# Patient Record
Sex: Male | Born: 1945 | Race: White | Hispanic: No | Smoking: Never smoker
Health system: Southern US, Community
[De-identification: ages and names within clinical notes are randomized; demographics above are authoritative.]

## PROBLEM LIST (undated history)

## (undated) DIAGNOSIS — I639 Cerebral infarction, unspecified: Secondary | ICD-10-CM

## (undated) DIAGNOSIS — I10 Essential (primary) hypertension: Secondary | ICD-10-CM

## (undated) DIAGNOSIS — I2510 Atherosclerotic heart disease of native coronary artery without angina pectoris: Secondary | ICD-10-CM

## (undated) DIAGNOSIS — G919 Hydrocephalus, unspecified: Secondary | ICD-10-CM

## (undated) DIAGNOSIS — I219 Acute myocardial infarction, unspecified: Secondary | ICD-10-CM

## (undated) DIAGNOSIS — R482 Apraxia: Secondary | ICD-10-CM

## (undated) DIAGNOSIS — R531 Weakness: Secondary | ICD-10-CM

## (undated) DIAGNOSIS — I69351 Hemiplegia and hemiparesis following cerebral infarction affecting right dominant side: Secondary | ICD-10-CM

## (undated) DIAGNOSIS — I4891 Unspecified atrial fibrillation: Secondary | ICD-10-CM

## (undated) DIAGNOSIS — E785 Hyperlipidemia, unspecified: Secondary | ICD-10-CM

## (undated) DIAGNOSIS — G20A1 Parkinson's disease without dyskinesia, without mention of fluctuations: Secondary | ICD-10-CM

## (undated) DIAGNOSIS — G459 Transient cerebral ischemic attack, unspecified: Secondary | ICD-10-CM

## (undated) DIAGNOSIS — D128 Benign neoplasm of rectum: Secondary | ICD-10-CM

## (undated) DIAGNOSIS — F339 Major depressive disorder, recurrent, unspecified: Secondary | ICD-10-CM

## (undated) DIAGNOSIS — Z86718 Personal history of other venous thrombosis and embolism: Secondary | ICD-10-CM

## (undated) DIAGNOSIS — F329 Major depressive disorder, single episode, unspecified: Secondary | ICD-10-CM

## (undated) DIAGNOSIS — R4701 Aphasia: Secondary | ICD-10-CM

## (undated) DIAGNOSIS — I83893 Varicose veins of bilateral lower extremities with other complications: Secondary | ICD-10-CM

## (undated) DIAGNOSIS — G8929 Other chronic pain: Secondary | ICD-10-CM

## (undated) DIAGNOSIS — R41841 Cognitive communication deficit: Secondary | ICD-10-CM

## (undated) DIAGNOSIS — F4323 Adjustment disorder with mixed anxiety and depressed mood: Secondary | ICD-10-CM

## (undated) DIAGNOSIS — I872 Venous insufficiency (chronic) (peripheral): Secondary | ICD-10-CM

## (undated) DIAGNOSIS — J8410 Pulmonary fibrosis, unspecified: Secondary | ICD-10-CM

## (undated) HISTORY — DX: Apraxia: R48.2

## (undated) HISTORY — DX: Aphasia: R47.01

## (undated) HISTORY — DX: Hemiplegia and hemiparesis following cerebral infarction affecting right dominant side: I69.351

## (undated) HISTORY — DX: Unspecified atrial fibrillation: I48.91

## (undated) HISTORY — PX: CARDIAC SURGERY: SHX584

## (undated) HISTORY — PX: EYE SURGERY: SHX253

---

## 2011-01-04 HISTORY — PX: PHACOEMULSIFICATION, IOL CATARACT: SHX4968

## 2011-09-04 HISTORY — PX: CARDIAC CATHETERIZATION: SHX172

## 2011-09-19 DIAGNOSIS — R9439 Abnormal result of other cardiovascular function study: Secondary | ICD-10-CM

## 2011-09-19 HISTORY — DX: Abnormal result of other cardiovascular function study: R94.39

## 2011-09-30 DIAGNOSIS — I8003 Phlebitis and thrombophlebitis of superficial vessels of lower extremities, bilateral: Secondary | ICD-10-CM

## 2011-09-30 HISTORY — DX: Phlebitis and thrombophlebitis of superficial vessels of lower extremities, bilateral: I80.03

## 2015-01-04 HISTORY — PX: CORONARY ANGIOPLASTY WITH STENT PLACEMENT: SHX49

## 2015-10-16 DIAGNOSIS — G3184 Mild cognitive impairment, so stated: Secondary | ICD-10-CM

## 2015-10-16 HISTORY — DX: Mild cognitive impairment of uncertain or unknown etiology: G31.84

## 2016-01-04 HISTORY — PX: VENTRICULOPERITONEAL SHUNT: SHX204

## 2016-06-21 DIAGNOSIS — G912 (Idiopathic) normal pressure hydrocephalus: Secondary | ICD-10-CM

## 2016-06-21 HISTORY — DX: (Idiopathic) normal pressure hydrocephalus: G91.2

## 2016-08-10 ENCOUNTER — Encounter (HOSPITAL_COMMUNITY): Payer: Self-pay

## 2016-08-10 ENCOUNTER — Emergency Department (HOSPITAL_COMMUNITY): Payer: Medicare Other

## 2016-08-10 DIAGNOSIS — I2510 Atherosclerotic heart disease of native coronary artery without angina pectoris: Secondary | ICD-10-CM | POA: Insufficient documentation

## 2016-08-10 DIAGNOSIS — Z7982 Long term (current) use of aspirin: Secondary | ICD-10-CM | POA: Insufficient documentation

## 2016-08-10 DIAGNOSIS — I10 Essential (primary) hypertension: Secondary | ICD-10-CM | POA: Diagnosis not present

## 2016-08-10 DIAGNOSIS — H532 Diplopia: Secondary | ICD-10-CM | POA: Insufficient documentation

## 2016-08-10 DIAGNOSIS — Z7901 Long term (current) use of anticoagulants: Secondary | ICD-10-CM | POA: Diagnosis not present

## 2016-08-10 DIAGNOSIS — Z8673 Personal history of transient ischemic attack (TIA), and cerebral infarction without residual deficits: Secondary | ICD-10-CM | POA: Diagnosis not present

## 2016-08-10 DIAGNOSIS — R202 Paresthesia of skin: Secondary | ICD-10-CM | POA: Insufficient documentation

## 2016-08-10 DIAGNOSIS — G919 Hydrocephalus, unspecified: Secondary | ICD-10-CM | POA: Diagnosis not present

## 2016-08-10 LAB — DIFFERENTIAL
Basophils Absolute: 0 10*3/uL (ref 0.0–0.1)
Basophils Relative: 1 %
EOS ABS: 0.1 10*3/uL (ref 0.0–0.7)
EOS PCT: 2 %
LYMPHS ABS: 1.9 10*3/uL (ref 0.7–4.0)
LYMPHS PCT: 47 %
MONOS PCT: 12 %
Monocytes Absolute: 0.5 10*3/uL (ref 0.1–1.0)
NEUTROS PCT: 38 %
Neutro Abs: 1.6 10*3/uL — ABNORMAL LOW (ref 1.7–7.7)

## 2016-08-10 LAB — I-STAT TROPONIN, ED: TROPONIN I, POC: 0.02 ng/mL (ref 0.00–0.08)

## 2016-08-10 LAB — PROTIME-INR
INR: 0.97
Prothrombin Time: 12.9 seconds (ref 11.4–15.2)

## 2016-08-10 LAB — I-STAT CHEM 8, ED
BUN: 18 mg/dL (ref 6–20)
CALCIUM ION: 1.21 mmol/L (ref 1.15–1.40)
CREATININE: 1 mg/dL (ref 0.61–1.24)
Chloride: 102 mmol/L (ref 101–111)
Glucose, Bld: 117 mg/dL — ABNORMAL HIGH (ref 65–99)
HEMATOCRIT: 40 % (ref 39.0–52.0)
HEMOGLOBIN: 13.6 g/dL (ref 13.0–17.0)
Potassium: 4.4 mmol/L (ref 3.5–5.1)
Sodium: 142 mmol/L (ref 135–145)
TCO2: 28 mmol/L (ref 0–100)

## 2016-08-10 LAB — COMPREHENSIVE METABOLIC PANEL
ALBUMIN: 3.9 g/dL (ref 3.5–5.0)
ALK PHOS: 52 U/L (ref 38–126)
ALT: 40 U/L (ref 17–63)
ANION GAP: 7 (ref 5–15)
AST: 34 U/L (ref 15–41)
BILIRUBIN TOTAL: 0.5 mg/dL (ref 0.3–1.2)
BUN: 15 mg/dL (ref 6–20)
CALCIUM: 9.2 mg/dL (ref 8.9–10.3)
CO2: 28 mmol/L (ref 22–32)
Chloride: 105 mmol/L (ref 101–111)
Creatinine, Ser: 1.08 mg/dL (ref 0.61–1.24)
GFR calc Af Amer: >60 mL/min (ref >60)
GLUCOSE: 124 mg/dL — AB (ref 65–99)
POTASSIUM: 4.5 mmol/L (ref 3.5–5.1)
Sodium: 140 mmol/L (ref 135–145)
TOTAL PROTEIN: 7.6 g/dL (ref 6.5–8.1)

## 2016-08-10 LAB — CBC
HEMATOCRIT: 39.4 % (ref 39.0–52.0)
HEMOGLOBIN: 13.5 g/dL (ref 13.0–17.0)
MCH: 30.7 pg (ref 26.0–34.0)
MCHC: 34.3 g/dL (ref 30.0–36.0)
MCV: 89.5 fL (ref 78.0–100.0)
Platelets: 141 10*3/uL — ABNORMAL LOW (ref 150–400)
RBC: 4.4 MIL/uL (ref 4.22–5.81)
RDW: 12.8 % (ref 11.5–15.5)
WBC: 4.2 10*3/uL (ref 4.0–10.5)

## 2016-08-10 LAB — APTT: aPTT: 25 seconds (ref 24–36)

## 2016-08-10 NOTE — ED Notes (Signed)
PT reports he feels his symptoms of increased ICP have been returning over the last week. He reports worsened gait, memory loss, and urinary incontinence.

## 2016-08-10 NOTE — ED Triage Notes (Signed)
Pt arrives to ED with complaints of double vision and numbness of bottom lip. Pt reports the symptoms come ans go and he first noticed it about a week and a half ago. He states tonight he had significant double vision for about 10 seconds and then it went away. While waiting to be triaged pt reports he had numbness to to the left side of face lasting about 15 seconds. PT reports he is seen at [facility] for hydrocephalus and is supposed to have shunt placed September 7th. He reports he has ~400cc CSF fluid drained on June 21 and is concerned this is a complication to his condition.

## 2016-08-11 ENCOUNTER — Emergency Department (HOSPITAL_COMMUNITY)
Admission: EM | Admit: 2016-08-11 | Discharge: 2016-08-11 | Disposition: A | Discharge status: Home / Self Care | Payer: Medicare Other | Attending: Emergency Medicine | Admitting: Emergency Medicine

## 2016-08-11 DIAGNOSIS — R202 Paresthesia of skin: Secondary | ICD-10-CM

## 2016-08-11 HISTORY — DX: Cerebral infarction, unspecified: I63.9

## 2016-08-11 HISTORY — DX: Hydrocephalus, unspecified: G91.9

## 2016-08-11 HISTORY — DX: Atherosclerotic heart disease of native coronary artery without angina pectoris: I25.10

## 2016-08-11 HISTORY — DX: Acute myocardial infarction, unspecified: I21.9

## 2016-08-11 HISTORY — DX: Essential (primary) hypertension: I10

## 2016-08-11 MED ORDER — GABAPENTIN 300 MG PO CAPS: 300.0000 mg | ORAL_CAPSULE | Freq: Three times a day (TID) | ORAL | 0 refills | Status: AC

## 2016-08-11 NOTE — Discharge Instructions (Signed)
Please read attached information. If you experience any new or worsening signs or symptoms please return to the emergency room for evaluation. Please follow-up with your primary care provider or specialist as discussed. Please use medication prescribed only as directed and discontinue taking if you have any concerning signs or symptoms.   °

## 2016-08-11 NOTE — ED Provider Notes (Signed)
[facility]-EMERGENCY DEPT Provider Note   CSN: [id number] Arrival date & time: 08/10/16  2029   History   Chief Complaint Chief Complaint  Patient presents with  . Visual Field Change  . Numbness    HPI Dwayne Patel is a 71 y.o. male.   HPI   70 year old male presents today with complaints of double vision and paresthesias. Patient has a significant past medical history of hypertension, hyperlipidemia, stroke 2014, MI 2015, hydrocephalus.  Patient notes that in June he was diagnosed with hydrocephalus and had a drain placed. He notes that he is followed at the [facility] clinic for this, and is scheduled for a shunt in September. Patient notes that he chronically has some memory dysfunction, unsteady gait, incontinence. He notes acutely last night around 5:30 while sitting he had a episode of double vision, and tingling around his lips. He denies any associated distal neurological deficits, headache, confusion, chest pain or shortness of breath. He reports this lasted we're seconds and resolved. He notes again he had tingling in his lips for several seconds this morning around 3 AM while in the waiting room.   Past Medical History:  Diagnosis Date  . Coronary artery disease   . CVA (cerebral vascular accident) (HCC)   . Hydrocephalus   . Hypertension   . Myocardial infarct (HCC)     There are no active problems to display for this patient.   Past Surgical History:  Procedure Laterality Date  . CARDIAC SURGERY    . EYE SURGERY         Home Medications    Prior to Admission medications   Medication Sig Start Date End Date Taking? Authorizing Provider  aspirin EC 81 MG tablet Take 81 mg by mouth daily.   Yes [provider]  atorvastatin (LIPITOR) 80 MG tablet Take 80 mg by mouth daily. 07/16/16  Yes [provider]  clopidogrel (PLAVIX) 75 MG tablet Take 75 mg by mouth daily.   Yes [provider]  metoprolol succinate (TOPROL-XL) 25 MG 24 hr  tablet Take 12.5 mg by mouth daily. 07/16/16  Yes [provider]  omeprazole (PRILOSEC) 20 MG capsule Take 20 mg by mouth daily. 07/22/16  Yes [provider]  gabapentin (NEURONTIN) 300 MG capsule Take 1 capsule (300 mg total) by mouth 3 (three) times daily. 08/11/16   Eyvonne Mechanic, PA-C    Family History No family history on file.  Social History Social History  Substance Use Topics  . Smoking status: Never Smoker  . Smokeless tobacco: Never Used  . Alcohol use No     Allergies   Ace inhibitors   Review of Systems Review of Systems  All other systems reviewed and are negative.    Physical Exam Updated Vital Signs BP (!) 161/72 (BP Location: Left Arm)   Pulse 64   Temp 98 F (36.7 C) (Oral)   Resp 16   Ht 5\' 8"  (1.727 m)   Wt 90.7 kg (200 lb)   SpO2 100%   BMI 30.41 kg/m   Physical Exam  Constitutional: He is oriented to person, place, and time. He appears well-developed and well-nourished.  HENT:  Head: Normocephalic and atraumatic.  Eyes: Pupils are equal, round, and reactive to light. Conjunctivae are normal. Right eye exhibits no discharge. Left eye exhibits no discharge. No scleral icterus.  Neck: Normal range of motion. No JVD present. No tracheal deviation present.  Cardiovascular: Normal rate, regular rhythm, normal heart sounds and intact distal pulses.  No murmur heard. Pulmonary/Chest: Effort normal and breath sounds normal. No stridor. No respiratory distress. He has no wheezes. He has no rales. He exhibits no tenderness.  Musculoskeletal: He exhibits no edema.  Neurological: He is alert and oriented to person, place, and time. He has normal strength. No cranial nerve deficit or sensory deficit. Coordination normal. GCS eye subscore is 4. GCS verbal subscore is 5. GCS motor subscore is 6.  Psychiatric: He has a normal mood and affect. His behavior is normal. Judgment and thought content normal.  Nursing note and vitals  reviewed.   ED Treatments / Results  Labs (all labs ordered are listed, but only abnormal results are displayed) Labs Reviewed  CBC - Abnormal; Notable for the following:       Result Value   Platelets 141 (*)    All other components within normal limits  DIFFERENTIAL - Abnormal; Notable for the following:    Neutro Abs 1.6 (*)    All other components within normal limits  COMPREHENSIVE METABOLIC PANEL - Abnormal; Notable for the following:    Glucose, Bld 124 (*)    All other components within normal limits  I-STAT CHEM 8, ED - Abnormal; Notable for the following:    Glucose, Bld 117 (*)    All other components within normal limits  PROTIME-INR  APTT  I-STAT TROPONIN, ED  CBG MONITORING, ED    EKG  EKG Interpretation  Date/Time:  Wednesday August 10 2016 21:13:55 EDT Ventricular Rate:  65 PR Interval:  144 QRS Duration: 96 QT Interval:  408 QTC Calculation: 424 R Axis:   13 Text Interpretation:  Normal sinus rhythm Normal ECG No old tracing to compare Confirmed by Ward, Baxter Hire [phone]) on 08/11/2016 5:41:49 AM       Radiology Ct Head Wo Contrast  Result Date: 08/10/2016 CLINICAL DATA:  Acute onset of mouth numbness and double vision. Initial encounter. EXAM: CT HEAD WITHOUT CONTRAST TECHNIQUE: Contiguous axial images were obtained from the base of the skull through the vertex without intravenous contrast. COMPARISON:  None. FINDINGS: Brain: No evidence of acute infarction, hemorrhage, hydrocephalus, extra-axial collection or mass lesion/mass effect. Prominence of the ventricles and sulci reflects moderate cortical volume loss. Scattered periventricular and subcortical white matter change likely reflects small vessel ischemic microangiopathy. Scattered chronic lacunar infarcts are noted at the cerebellar hemispheres bilaterally. The brainstem and fourth ventricle are within normal limits. The basal ganglia are unremarkable in appearance. The cerebral hemispheres demonstrate  grossly normal gray-white differentiation. No mass effect or midline shift is seen. Vascular: No hyperdense vessel or unexpected calcification. Skull: There is no evidence of fracture; visualized osseous structures are unremarkable in appearance. Sinuses/Orbits: The orbits are within normal limits. Mucosal thickening is noted at the right maxillary sinus. The patient is status post right-sided maxillary antrostomy. The remaining paranasal sinuses and mastoid air cells are well-aerated. Other: No significant soft tissue abnormalities are seen. IMPRESSION: 1. No acute intracranial pathology seen on CT. 2. Moderate cortical volume loss and scattered small vessel ischemic microangiopathy. 3. 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 Electronically Signed   By: Roanna Raider M.D.   On: 08/10/2016 22:42    Procedures Procedures (including critical care time)  Medications Ordered in ED Medications - No data to display   Initial Impression / Assessment and Plan / ED Course  I have reviewed the triage vital signs and the nursing notes.  Pertinent labs & imaging results that were available during my care of the patient  were reviewed by me and considered in my medical decision making (see chart for details).      Final Clinical Impressions(s) / ED Diagnoses   Final diagnoses:  Paresthesia    Labs: I-STAT Chem-8, i-STAT troponin, PT/INR, APTT, CBC, differential, CMP  Imaging: CT head without  Consults:Neurology  Therapeutics:  Discharge Meds: Gabapentin  Assessment/Plan: 71 year old male presents today with likely paresthesias. Patient is asymptomatic at the time my evaluation. His presentation is not consistent with stroke, TIA, or any decreased perfusion to the brain. I discussed the case with neurology who recommended starting patient on gabapentin 300 3 times a day for paresthesias. Patient will follow up closely with [place] neurolog[place] in [place] on Monday, he is  given strict return precautions. He verbalized understanding and agreement to today's plan had no further questions or concerns at time of discharge.       New Prescriptions New Prescriptions   GABAPENTIN (NEURONTIN) 300 MG CAPSULE    Take 1 capsule (300 mg total) by mouth 3 (three) times daily.     Eyvonne MechanicHedges, Orianna Biskup, PA-C 08/11/16 [id number]    Shon BatonHorton, Courtney F, MD 08/15/16 [phone]

## 2016-08-11 NOTE — ED Provider Notes (Signed)
Medical screening examination/treatment/procedure(s) were conducted as a shared visit with non-physician practitioner(s) and myself.  I personally evaluated the patient during the encounter.   EKG Interpretation  Date/Time:  Wednesday August 10 2016 21:13:55 EDT Ventricular Rate:  65 PR Interval:  144 QRS Duration: 96 QT Interval:  408 QTC Calculation: 424 R Axis:   13 Text Interpretation:  Normal sinus rhythm Normal ECG No old tracing to compare Confirmed by Loria Lacina, Baxter HireKristen [phone]([id number]) on 08/11/2016 5:41:49 AM      Patient is a 71 year old male with history of normal pressure hydrocephalus who has had recent [facility] [place] in June who presents emergency department with complaints of numbness that occurred to his lower lip bilaterally, binocular diplopia that started yesterday. Has had 2 episodes of these symptoms that lasted for several seconds and then resolve. He currently has difficulty with word finding and some ataxic gait but states this is chronic for him but has improved since his last CSF drainage. He is scheduled to have a [facility]. On exam, patient is currently neurologically intact. His labs are unremarkable.  His head CT is unremarkable. He does not have any signs of hydrocephalus currently.  Neurology recommended increasing gabapentin for paresthesias. We'll agree that patient can be discharged with close follow-up with his neurologist at [facility] which he has scheduled beginning of next week. Discussed return precautions. Patient comfortable with this plan and is ready for discharge.   Dwayne Patel, Layla MawKristen N, DO 08/11/16 2301

## 2016-09-09 HISTORY — PX: VENTRICULOPERITONEAL SHUNT: SHX204

## 2018-04-24 IMAGING — CT CT HEAD W/O CM
3 series · 15 of 47 positions shown, 18 images · non-contrast
Comparison: None.

CLINICAL DATA: Acute onset of mouth numbness and double vision.
Initial encounter.

EXAM:
CT HEAD WITHOUT CONTRAST
TECHNIQUE: Contiguous axial images were obtained from the base of the skull
through the vertex without intravenous contrast.

[Series 3: head 5.0 h30s · axial · 0.42mm/px · z∈[-161,-6]mm · 9 of 37 slices shown, 12 images]
[im 3/37  brain]
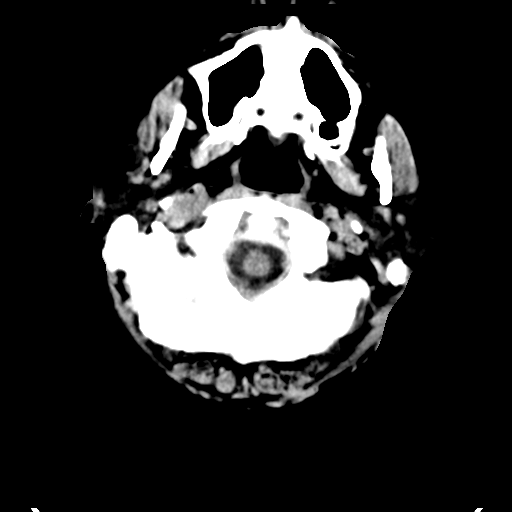
[im 3/37  bone]
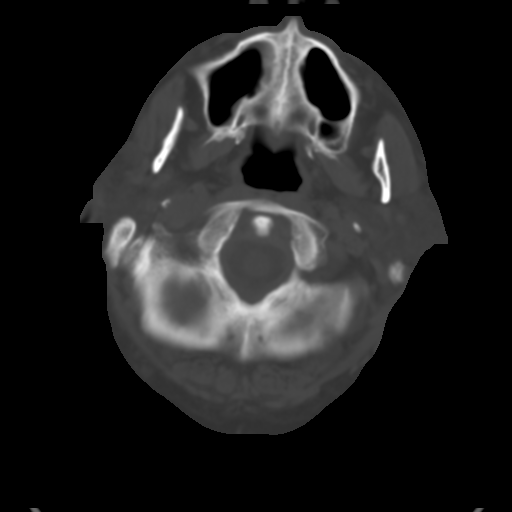
[im 7/37  brain]
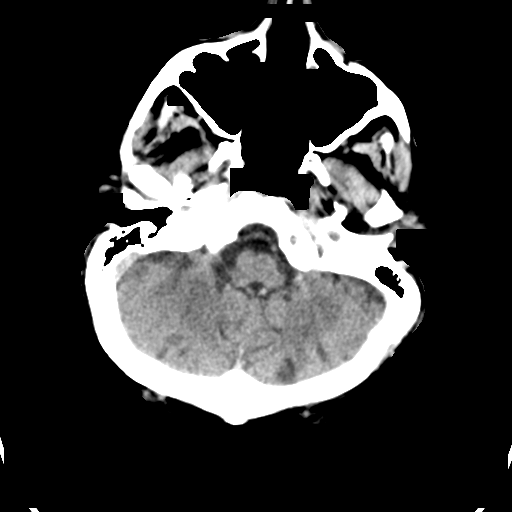
[im 10/37  brain]
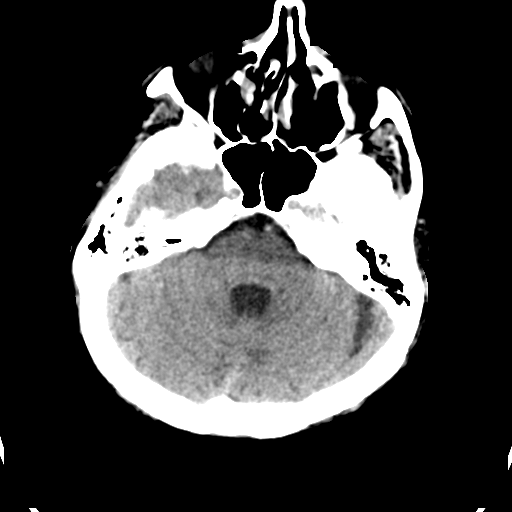
[im 14/37  brain]
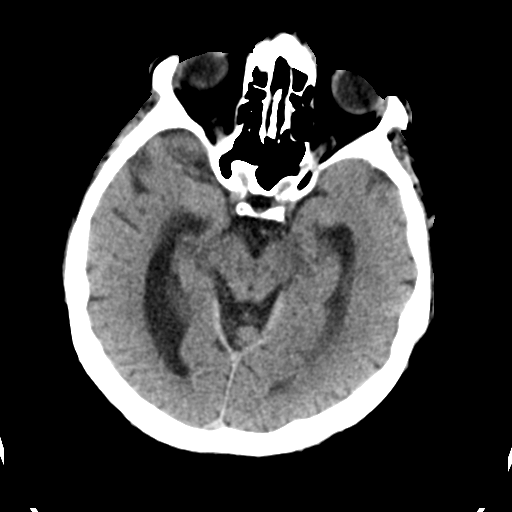
[im 19/37  brain]
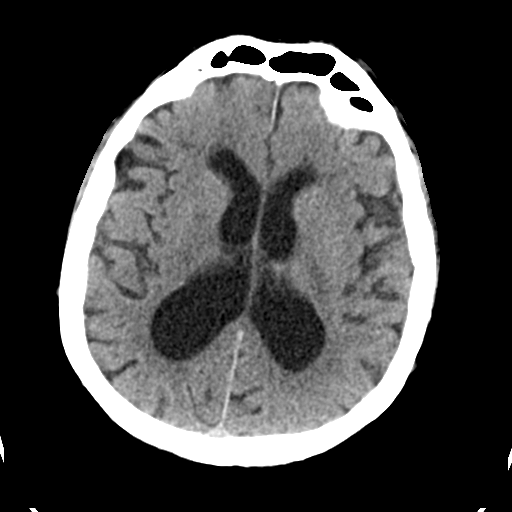
[im 19/37  bone]
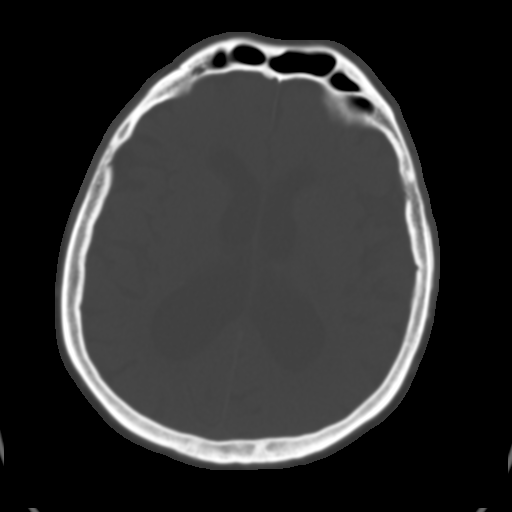
[im 23/37  brain]
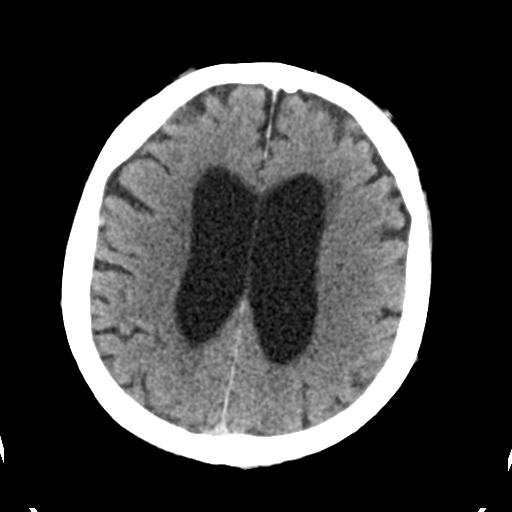
[im 27/37  brain]
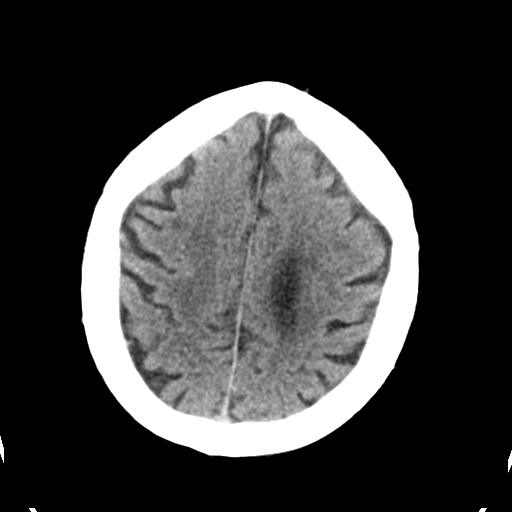
[im 30/37  brain]
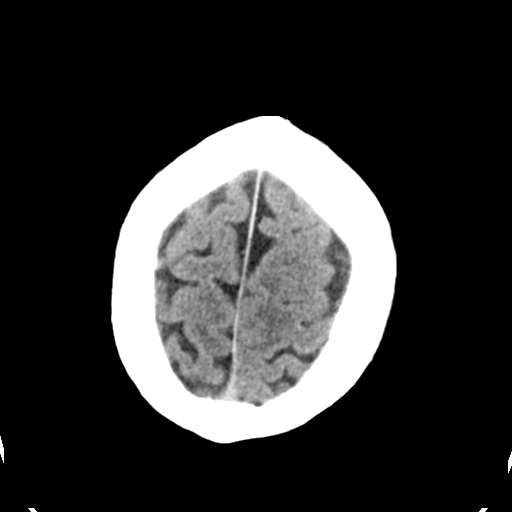
[im 34/37  brain]
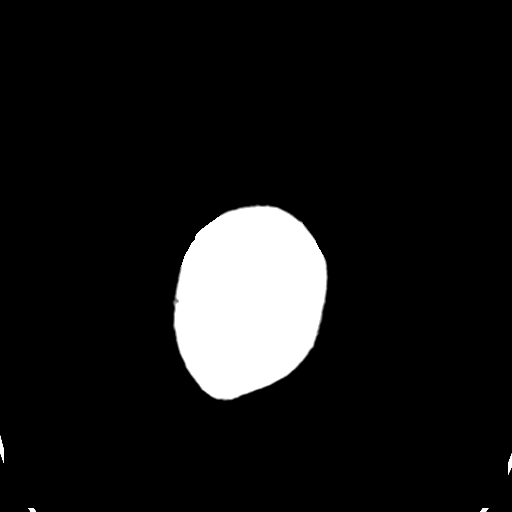
[im 34/37  bone]
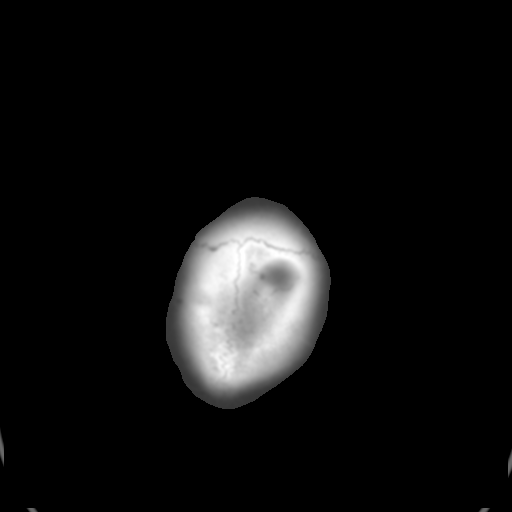

[Series 5: head 3.0 mpr cor · coronal · 0.36mm/px · 3 of 68 slices shown]
[im 23/68  brain]
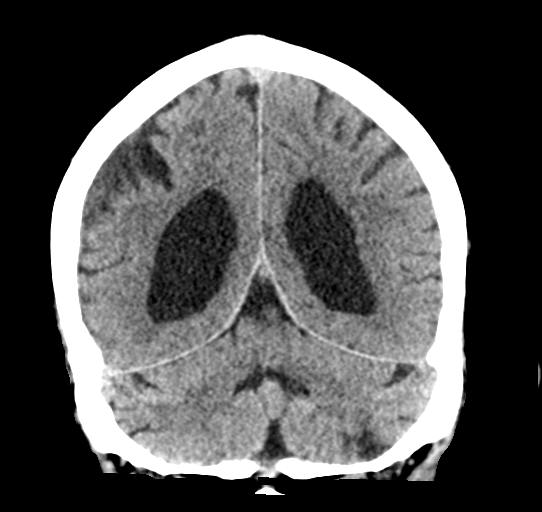
[im 30/68  brain]
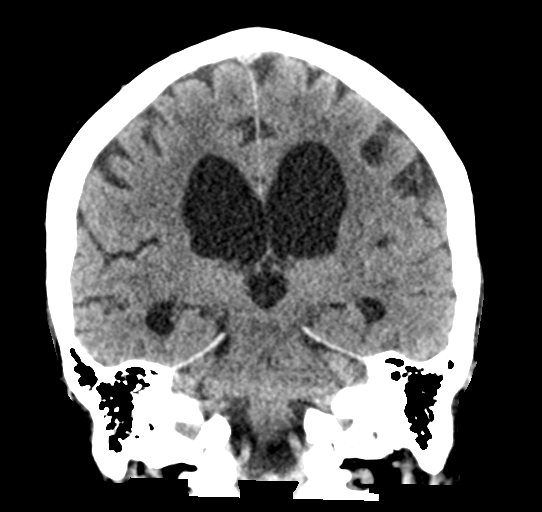
[im 38/68  brain]
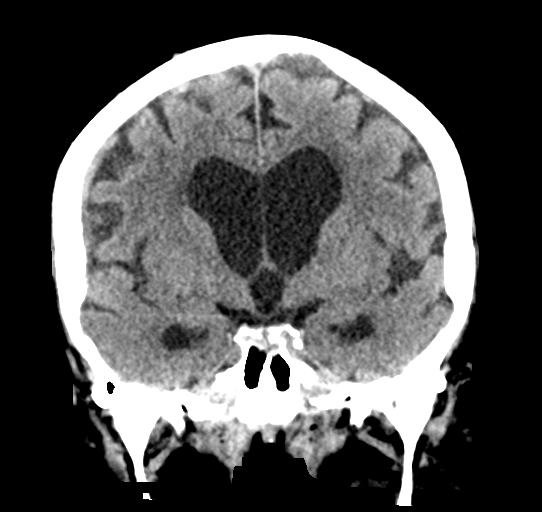

[Series 6: head 3.0 mpr sag · sagittal · 0.38mm/px · 3 of 66 slices shown]
[im 22/66  brain]
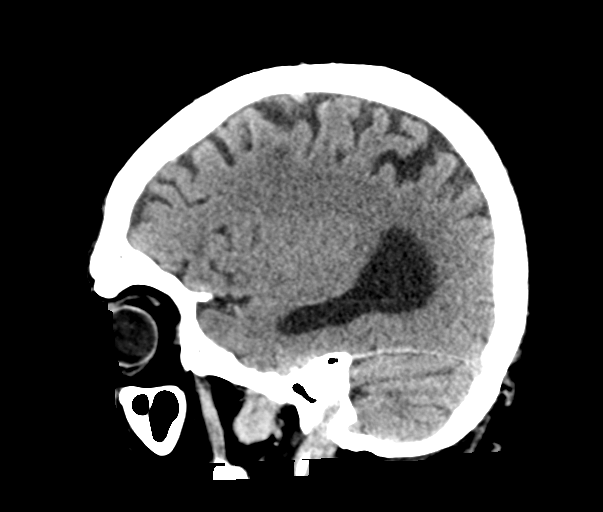
[im 33/66  brain]
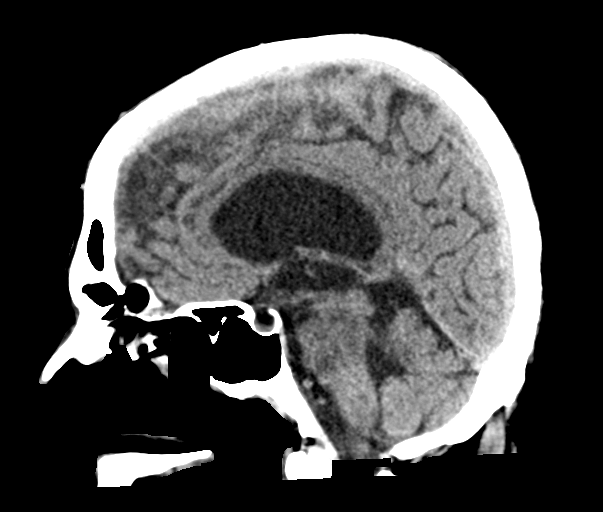
[im 44/66  brain]
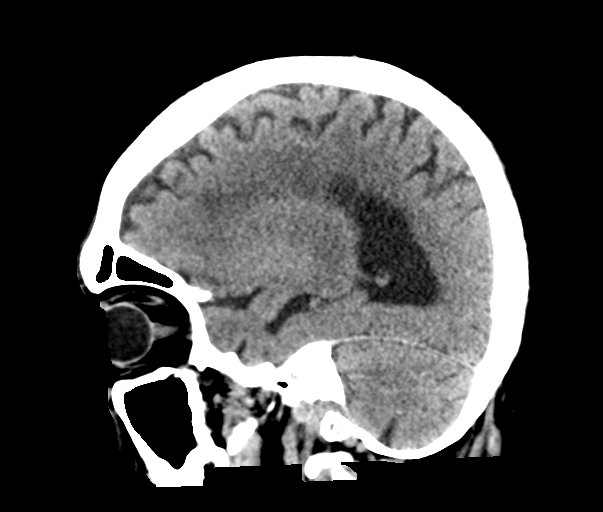

[15 of 47 positions shown; findings below may reference images not displayed]

FINDINGS: Brain: No evidence of acute infarction, hemorrhage, hydrocephalus,
extra-axial collection or mass lesion/mass effect.

Prominence of the ventricles and sulci reflects moderate cortical
volume loss. Scattered periventricular and subcortical white matter
change likely reflects small vessel ischemic microangiopathy.
Scattered chronic lacunar infarcts are noted at the cerebellar
hemispheres bilaterally.

The brainstem and fourth ventricle are within normal limits. The
basal ganglia are unremarkable in appearance. The cerebral
hemispheres demonstrate grossly normal gray-white differentiation.
No mass effect or midline shift is seen.

Vascular: No hyperdense vessel or unexpected calcification.

Skull: There is no evidence of fracture; visualized osseous
structures are unremarkable in appearance.

Sinuses/Orbits: The orbits are within normal limits. Mucosal
thickening is noted at the right maxillary sinus. The patient is
status post right-sided maxillary antrostomy. The remaining
paranasal sinuses and mastoid air cells are well-aerated.

Other: No significant soft tissue abnormalities are seen.
IMPRESSION: 1. No acute intracranial pathology seen on CT.
2. Moderate cortical volume loss and scattered small vessel ischemic
microangiopathy.
3. Chronic lacunar infarcts at the cerebellar hemispheres
bilaterally.

## 2018-05-21 DIAGNOSIS — D6851 Activated protein C resistance: Secondary | ICD-10-CM

## 2018-05-21 DIAGNOSIS — I2609 Other pulmonary embolism with acute cor pulmonale: Secondary | ICD-10-CM

## 2018-05-21 HISTORY — DX: Other pulmonary embolism with acute cor pulmonale: I26.09

## 2018-05-21 HISTORY — DX: Activated protein C resistance: D68.51

## 2020-07-24 ENCOUNTER — Ambulatory Visit: Admission: RE | Admit: 2020-07-24 | Payer: Self-pay | Source: Ambulatory Visit

## 2020-07-26 DIAGNOSIS — I639 Cerebral infarction, unspecified: Secondary | ICD-10-CM

## 2020-07-26 HISTORY — DX: Cerebral infarction, unspecified: I63.9

## 2020-09-04 DIAGNOSIS — T85618D Breakdown (mechanical) of other specified internal prosthetic devices, implants and grafts, subsequent encounter: Secondary | ICD-10-CM

## 2020-09-04 HISTORY — DX: Breakdown (mechanical) of other specified internal prosthetic devices, implants and grafts, subsequent encounter: T85.618D

## 2021-06-22 ENCOUNTER — Telehealth (INDEPENDENT_AMBULATORY_CARE_PROVIDER_SITE_OTHER): Payer: Self-pay

## 2021-06-22 NOTE — Telephone Encounter (Signed)
06/22/21 1507  Called pt to confirm appt duaghter confirmed appt and will do E check in on MyChart and will bring in disc from July 2022. JB, LPN

## 2021-06-28 ENCOUNTER — Other Ambulatory Visit: Payer: Self-pay

## 2021-06-28 ENCOUNTER — Ambulatory Visit (INDEPENDENT_AMBULATORY_CARE_PROVIDER_SITE_OTHER): Payer: Medicare Other | Admitting: Physician Assistant

## 2021-06-28 ENCOUNTER — Encounter (INDEPENDENT_AMBULATORY_CARE_PROVIDER_SITE_OTHER): Payer: Self-pay

## 2021-06-28 ENCOUNTER — Encounter (INDEPENDENT_AMBULATORY_CARE_PROVIDER_SITE_OTHER): Payer: Self-pay | Admitting: Physician Assistant

## 2021-06-28 DIAGNOSIS — G912 (Idiopathic) normal pressure hydrocephalus: Secondary | ICD-10-CM | POA: Insufficient documentation

## 2021-06-28 DIAGNOSIS — Z982 Presence of cerebrospinal fluid drainage device: Secondary | ICD-10-CM

## 2021-06-28 NOTE — Progress Notes (Signed)
Review of Systems   Review of Systems   Constitution: Negative.   HENT:  Positive for hearing loss.    Eyes: Negative.    Cardiovascular: Negative.         Respiratory:  Positive for snoring.    Endocrine: Positive for cold intolerance.   Hematologic/Lymphatic: Negative.    Skin: Negative.    Musculoskeletal:  Positive for arthritis and muscle weakness.       Taken by: Jearld Pies, LPN

## 2021-06-28 NOTE — Progress Notes (Signed)
[facility]  New Patient Note    Referring MD: Judeth Cornfield, MD   Primary Care MD: Jeanann Lewandowsky, MD     MRN: [id number]    HPI   Chief Complaint:  NPH    HPI  [name] is a 76 y.o. 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 He is accompanied by his daughter. He recently moved to the area from [place].     He had initially VP shunt placement in 2018. He then developed worsening gait, and was found to have shunt block. He underwent revision in 2022 at [facility]. He unfortunately suffered a stroke around that same time with right-sided weakness. He was doing well until COVID infection in 10/2020. He has had difficulty walking since then, but reports gradual improvement over the past 2 months. He can walk a few steps with assistance. He still has intermittent urinary incontinence. He denies memory difficulty. He lives in a nursing home and does PT 2-3 times a week. Dr. Dillard Cannon referred him to establish care with neurosurgery. He is scheduled for follow up in 09/2021 with updated imaging.       Radiology Interpretation   No results found.     The images above were personally reviewed and discussed in detail with the patient.     Impression   [name] is a 76 y/o male with hx NPH s/p VP shunt with Certas valve set to 5 and CVA with residual right upper extremity weakness who presents today to establish care after relocating from [place]. He is doing well overall with no new symptoms or concerns. He has difficulty walking which is improving with PT. I verified the shunt setting at 5. I recommend continued follow up with neurology. I answered all questions to the best of my ability. Patient and daughter voiced understanding.     Plan   1. Continue PT  2. Follow up with neurology Dr. Dillard Cannon as scheduled  3. Follow up with  neurosurgery as needed, patient knows to schedule shunt check if he has MRI at any time     Follow-up   No follow-ups on file.     Medical History   History reviewed. No pertinent past medical history.     Surgical History     Past Surgical History:   Procedure Laterality Date    VENTRICULOPERITONEAL SHUNT  2018    shunt revision 2022        Family History     Family History   Problem Relation Age of Onset    Cancer Mother     Cancer Father     Hypertension Brother         Social History     Social History     Tobacco Use    Smoking status: Former     Types: Cigarettes     Quit date: 1971     Years since quitting: 52.5    Smokeless tobacco: Never   Substance Use Topics    Alcohol use: Not Currently        Current Medications       Current Outpatient Medications:     acetaminophen (TYLENOL) 325 MG tablet, 2 tablets (650 mg) every 4 (four) hours as needed, Disp: , Rfl:     amLODIPine (NORVASC) 10 MG tablet,  Take 1 tablet (10 mg) by mouth daily, Disp: , Rfl:     aspirin EC 81 MG EC tablet, Take 1 tablet (81 mg) by mouth daily, Disp: , Rfl:     atorvastatin (LIPITOR) 40 MG tablet, Take 1 tablet (40 mg) by mouth nightly, Disp: , Rfl:     buPROPion XL (WELLBUTRIN XL) 150 MG 24 hr tablet, Take 1 tablet (150 mg) by mouth daily, Disp: , Rfl:     citalopram (CeleXA) 20 MG tablet, Take 1 tablet (20 mg) by mouth daily, Disp: , Rfl:     cyanocobalamin (VITAMIN B12) 500 MCG tablet, Take by mouth, Disp: , Rfl:     cyclobenzaprine (FLEXERIL) 5 MG tablet, Take by mouth, Disp: , Rfl:     donepezil (ARICEPT) 5 MG tablet, 1, Disp: , Rfl:     omeprazole (PriLOSEC) 20 MG capsule, Take 1 capsule (20 mg) by mouth, Disp: , Rfl:     oxyCODONE (ROXICODONE) 5 MG immediate release tablet, Take 1-2 tablets (5-10 mg) by mouth every 6 (six) hours as needed, Disp: , Rfl:     rivaroxaban (XARELTO) 10 MG Tab, Take 1 tablet (10 mg) by mouth daily, Disp: , Rfl:     senna (SENOKOT) 8.6 MG tablet, Take 1 tablet (8.6 mg) by mouth 2 (two) times daily,  Disp: , Rfl:      Allergies     Allergies   Allergen Reactions    Ace Inhibitors      Other reaction(s): Cough        Review of Systems   Constitution: Negative.   HENT:  Positive for hearing loss.    Eyes: Negative.    Cardiovascular: Negative.    Respiratory:  Positive for snoring.    Endocrine: Positive for cold intolerance.   Hematologic/Lymphatic: Negative.    Skin: Negative.    Musculoskeletal:  Positive for arthritis and muscle weakness.     Physical Examination   VITAL SIGNS:    Vitals:    06/28/21 0832   BP: 132/77   Pulse: 76       General:  Well developed, well nourished, no apparent distress  Neck:  Supple, no JVD, no apparent lymphadenopathy  HEENT:  Head normocephalic, atraumatic, no obvious lesions in ear, nose or throat  Pulmonary: Normal respiratory effort, no audible wheezing  Cardiovascular: No pedal edema, pulses 2+ in bilat lower extremities  Abdominal: Non-tender to palpation, non-distended, no organomegaly, no palpable masses    Neurologic Exam    Shunt valve palpated  Reservoir refills briskly  Incisions well healed  No overlying skin changes     Mental Status    Awake, Alert, Oriented x3  Speech clear   GCS 15     Cranial Nerves   CN III, IV, VI   Pupils are equal, round, and reactive to light.  Extraocular motions are normal.     CN V: Normal sensation in all 3 trigeminal divisions b/l     CN VII: Facial expression full, symmetric.     CN VIII: Hearing intact b/l    CN XI: Shoulder shrug symmetric    CN XII: Tongue midline    Motor Exam   Overall muscle tone: normal     RUE 4/5  LUE/BLE strength 5/5   Right pronator drift    Gait: deferred    Sensory Exam  Sensation intact to light touch throughout    Reflexes  DTRs symmetric        Shawn Route, PA-C    ===============================================================  N.B.: This note was generated by the Epic EMR system/ Dragon speech recognition and may contain inherent errors or omissions not intended by the user. Grammatical errors,  random word insertions, deletions, pronoun errors and incomplete sentences are occasional consequences of this technology due to software limitations. Not all errors are caught or corrected. If there are questions or concerns about the content of this note or information contained within the body of this dictation they should be addressed directly with the author for clarification.

## 2021-10-19 ENCOUNTER — Emergency Department: Payer: Medicare Other

## 2021-10-19 ENCOUNTER — Emergency Department
Admission: EM | Admit: 2021-10-19 | Discharge: 2021-10-19 | Disposition: A | Discharge status: Home / Self Care | Payer: Medicare Other | Attending: Emergency Medicine | Admitting: Emergency Medicine

## 2021-10-19 DIAGNOSIS — E782 Mixed hyperlipidemia: Secondary | ICD-10-CM

## 2021-10-19 DIAGNOSIS — E785 Hyperlipidemia, unspecified: Secondary | ICD-10-CM

## 2021-10-19 DIAGNOSIS — R079 Chest pain, unspecified: Secondary | ICD-10-CM

## 2021-10-19 DIAGNOSIS — R072 Precordial pain: Secondary | ICD-10-CM | POA: Insufficient documentation

## 2021-10-19 HISTORY — DX: Aphasia: R47.01

## 2021-10-19 HISTORY — DX: Hyperlipidemia, unspecified: E78.5

## 2021-10-19 HISTORY — DX: Major depressive disorder, single episode, unspecified: F32.9

## 2021-10-19 HISTORY — DX: Other chronic pain: G89.29

## 2021-10-19 HISTORY — DX: Hemiplegia and hemiparesis following cerebral infarction affecting right dominant side: I69.351

## 2021-10-19 HISTORY — DX: Apraxia: R48.2

## 2021-10-19 HISTORY — DX: Unspecified atrial fibrillation: I48.91

## 2021-10-19 HISTORY — DX: Cerebral infarction, unspecified: I63.9

## 2021-10-19 LAB — ECG 12-LEAD
Atrial Rate: 80 {beats}/min
P Axis: 13 degrees
P-R Interval: 160 ms
P-R Interval: 164 ms
QRS Duration: 100 ms
QRS Duration: 96 ms
QTC Calculation (Bezet): 489 ms
T Axis: -14 degrees
T Axis: -26 degrees
Ventricular Rate: 80 {beats}/min

## 2021-10-19 LAB — COMPREHENSIVE METABOLIC PANEL
ALT: 18 U/L (ref 0–55)
AST (SGOT): 15 U/L (ref 5–41)
Albumin/Globulin Ratio: 1 (ref 0.9–2.2)
Albumin: 4 g/dL (ref 3.5–5.0)
Alkaline Phosphatase: 75 U/L (ref 37–117)
Anion Gap: 10 (ref 5.0–15.0)
BUN: 14 mg/dL (ref 9.0–28.0)
Bilirubin, Total: 0.7 mg/dL (ref 0.2–1.2)
CO2: 24 mEq/L (ref 17–29)
Calcium: 9 mg/dL (ref 7.9–10.2)
Chloride: 103 mEq/L (ref 99–111)
Creatinine: 0.8 mg/dL (ref 0.5–1.5)
Globulin: 4.2 g/dL — ABNORMAL HIGH (ref 2.0–3.6)
Glucose: 86 mg/dL (ref 70–100)
Potassium: 3.9 mEq/L (ref 3.5–5.3)
Protein, Total: 8.2 g/dL (ref 6.0–8.3)
Sodium: 137 mEq/L (ref 135–145)
eGFR: >60 mL/min/{1.73_m2} (ref >=60)

## 2021-10-19 LAB — CBC AND DIFFERENTIAL
Absolute NRBC: 0 10*3/uL (ref 0.00–0.00)
Basophils Absolute Automated: 0.06 10*3/uL (ref 0.00–0.08)
Basophils Automated: 1.3 %
Eosinophils Absolute Automated: 0.29 10*3/uL (ref 0.00–0.44)
Eosinophils Automated: 6.5 %
Hematocrit: 41.5 % (ref 37.6–49.6)
Hgb: 14.2 g/dL (ref 12.5–17.1)
Immature Granulocytes Absolute: 0.01 10*3/uL (ref 0.00–0.07)
Immature Granulocytes: 0.2 %
Instrument Absolute Neutrophil Count: 2.58 10*3/uL (ref 1.10–6.33)
Lymphocytes Absolute Automated: 1.13 10*3/uL (ref 0.42–3.22)
Lymphocytes Automated: 25.3 %
MCH: 30.3 pg (ref 25.1–33.5)
MCHC: 34.2 g/dL (ref 31.5–35.8)
MCV: 88.5 fL (ref 78.0–96.0)
MPV: 9.5 fL (ref 8.9–12.5)
Monocytes Absolute Automated: 0.39 10*3/uL (ref 0.21–0.85)
Monocytes: 8.7 %
Neutrophils Absolute: 2.58 10*3/uL (ref 1.10–6.33)
Neutrophils: 58 %
Nucleated RBC: 0 /100 WBC (ref 0.0–0.0)
Platelets: 175 10*3/uL (ref 142–346)
RBC: 4.69 10*6/uL (ref 4.20–5.90)
RDW: 13 % (ref 11–15)
WBC: 4.46 10*3/uL (ref 3.10–9.50)

## 2021-10-19 LAB — IHS D-DIMER: D-Dimer: 0.42 ug/mL FEU (ref 0.00–0.60)

## 2021-10-19 LAB — HIGH SENSITIVITY TROPONIN-I WITH DELTA
hs Troponin-I Delta: UNDETERMINED ng/L
hs Troponin-I: <2.7 ng/L

## 2021-10-19 LAB — HIGH SENSITIVITY TROPONIN-I: hs Troponin-I: 3.4 ng/L

## 2021-10-19 NOTE — ED Triage Notes (Signed)
pt from [facility], some aphasia @baseline$ , wheelchair bound @baseline$ , incont @ baseline, currently @baseline$  mental status, received COVID and flu shots yesterday...10-15 sec episode of sharp L CP @0512$ , another shortly after 0600 w/EMS present. 0/10 pain at this time. [place] Staff notifying daughter.

## 2021-10-19 NOTE — ED Provider Notes (Signed)
[place] HISTORY AND PHYSICAL EXAM     Patient Name: [name],[name]  Department:[facility]  Encounter Date:  10/19/2021  Attending Physician: Cleotis Nipper, MD   Age: 76 y.o. male  Patient Room: 18/18  PCP: Jeanann Lewandowsky, MD           Diagnosis/Disposition:     Final diagnoses:   Precordial pain       ED Disposition       ED Disposition   Discharge    Condition   --    Date/Time   Tue Oct 19, 2021 11:15 AM    Comment   Altamease Oiler discharge to home/self care.    Condition at disposition: Stable                 Follow-Up Providers (if applicable)    Jeanann Lewandowsky, MD  [address]  [place] [address]  [phone]    Schedule an appointment as soon as possible for a visit in 3 days      [facility]  [phone] [facility] [address]  [place] [phone]  [phone]  Schedule an appointment as soon as possible for a visit in 3 days         New Prescriptions    No medications on file           Medical Decision Making:       Patient course, medical decision making and reassessment prior to discharge : Patient with chest pain, atypical, nonexertional.  EKG shows no ST elevation or ischemia.  Patient also found to have negative D-dimer and ultrasound the legs are negative.  Repeat troponin x2 have been negative.  Stable for outpatient follow-up.  Asymptomatic at time of discharge.    Counseled pt about diagnosis and/or relevant lab/radiology results if any present. Recommended immediate ED visit for any new or worsening problems or concerns. Patient expressed understanding and agreement with discharge plan. Well appearing at discharge. Close and timed follow up recommended.          EKG Interpretation#2  EKG interpreted independently by Dr. Cleotis Nipper    Rate: Normal  Rhythm: sinus rhythm  Axis: Normal  ST-T Segments: nonspec st-t changes  Conduction: No blocks  Impression: Non-specific EKG    No significant change  when compared from previous EKG  on record.    Cleotis Nipper, MD                ED Course as of 10/19/21 1116   Tue Oct 19, 2021   0703 Oxygen saturation by pulse oximetry is 91%-94%, Low Normal.  Interventions: None Needed.    Attending Dr. Cleotis Nipper        EKG Interpretation  EKG interpreted independently by ED physician  Rate: Normal for age.  Rhythm: Normal sinus rhythm  Axis: Normal for age  PR, QRS and QT intervals:  normal for age and rate  ST Segments: No deviations suggestive of ischemia  Impression: Normal ECG with no evidence of ischemia.      Attending : Dr. Cleotis Nipper     [PV]   [phone] Dr. Cleotis Nipper, ED Physician independently visualized and interpreted images of CXR  Intrepretation by ED Physician: no pneumothorax, no lobar infiltrate.     [PV]      ED Course User Index  [PV] Cleotis Nipper, MD           Number  and Complexity of Problems Addressed (select at least one)    Complexity: High: 1 acute or chronic illness or injury that poses a threat to life or bodily function      Presenting acute/chronic problems: Acute chest pain    Differential diagnosis to include but not limited to:   CAD, atypical CP, PE, Pneumonia, Pleurisy.            Chronic illness impacting care and increasing risk of presenting acute or chronic problems (obesity as defined as BMI>30, diabetes, hypertension, cad, elderly as defined as age 19 years or older): elderly    ______________________________________________________________________  Amount/complexity of Data Reviewed   :[id number]}  Documented in other places on chart in the MDM section if available/relevant (External old records review, EKG interpretation, radiological interpretation, and/or consultants conversations)    History obtained from another historian - SEE HPI for details if available.     Diagnostic tests appropriately considered even if not ultimately performed: n/a    ______________________________________________________________________    Risk of  Complications and/or Morbidity or Mortality of Patient Management  (select one if applicable)    Risk: Moderate      Medical Decision Making  Amount and/or Complexity of Data Reviewed  Labs: ordered. Decision-making details documented in ED Course.  Radiology: ordered and independent interpretation performed. Decision-making details documented in ED Course.  ECG/medicine tests: ordered and independent interpretation performed. Decision-making details documented in ED Course.                       History of Presenting Illness:     Nursing Triage note: @0512$  had 15 sec sharp L CP, another episode after EMS arrived about 0600. @baseline$  mental status, from [facility]  Chief complaint: Chest Pain    [name] is a 76 y.o. male  with right-sided chest pain lasting approximately 15 to 20 minutes today.  States another episode of short chest pain shortly thereafter.  Denies any radiation, nausea, vomiting, shortness of breath, abdominal pain, diaphoresis.  Currently asymptomatic and denies any chest pain.        Medications - No data to display    Home Medications       Med List Status: Complete Set By: Harvie Bridge, RN at 10/19/2021  6:57 AM              acetaminophen (TYLENOL) 325 MG tablet     2 tablets (650 mg) every 4 (four) hours as needed     amLODIPine (NORVASC) 10 MG tablet     Take 1 tablet (10 mg) by mouth daily     aspirin EC 81 MG EC tablet     Take 1 tablet (81 mg) by mouth daily     atorvastatin (LIPITOR) 40 MG tablet     Take 1 tablet (40 mg) by mouth nightly     cyanocobalamin (VITAMIN B12) 500 MCG tablet     Take by mouth     rivaroxaban (XARELTO) 10 MG Tab     Take 1 tablet (10 mg) by mouth daily            Past Medical History:   Diagnosis Date    Aphasia     Apraxia     Atrial fibrillation     Chronic pain     CVA (cerebral vascular accident)     Hemiplegia and hemiparesis following cerebral infarction affecting right dominant side     HLD (hyperlipidemia)     Major  depression         Past Surgical History:   Procedure Laterality Date    VENTRICULOPERITONEAL SHUNT  2018    shunt revision 2022       Social History     Socioeconomic History    Marital status: Unknown     Spouse name: None    Number of children: None    Years of education: None    Highest education level: None   Occupational History    None   Tobacco Use    Smoking status: Former     Types: Cigarettes     Quit date: 1971     Years since quitting: 52.8    Smokeless tobacco: Never   Vaping Use    Vaping Use: Never used   Substance and Sexual Activity    Alcohol use: Not Currently    Drug use: Never    Sexual activity: Not Currently   Other Topics Concern    None   Social History Narrative    None     Social Determinants of Health     Financial Resource Strain: Low Risk  (06/28/2021)    Overall Financial Resource Strain (CARDIA)     Difficulty of Paying Living Expenses: Not hard at all   Food Insecurity: No Food Insecurity (06/28/2021)    Hunger Vital Sign     Worried About Running Out of Food in the Last Year: Never true     Ran Out of Food in the Last Year: Never true   Transportation Needs: No Transportation Needs (06/28/2021)    PRAPARE - Armed forces logistics/support/administrative officer (Medical): No     Lack of Transportation (Non-Medical): No   Physical Activity: Unknown (06/28/2021)    Exercise Vital Sign     Days of Exercise per Week: 0 days     Minutes of Exercise per Session: Not on file   Stress: Stress Concern Present (06/28/2021)    [place]     Feeling of Stress : To some extent   Social Connections: Socially Isolated (06/28/2021)    Social Connection and Isolation Panel [NHANES]     Frequency of Communication with Friends and Family: Once a week     Frequency of Social Gatherings with Friends and Family: Once a week     Attends Religious Services: Never     Marine scientist or Organizations: No     Attends Archivist Meetings: Never     Marital  Status: Widowed   Intimate Partner Violence: Not At Risk (06/28/2021)    Humiliation, Afraid, Rape, and Kick questionnaire     Fear of Current or Ex-Partner: No     Emotionally Abused: No     Physically Abused: No     Sexually Abused: No   Housing Stability: Low Risk  (06/28/2021)    Housing Stability Vital Sign     Unable to Pay for Housing in the Last Year: No     Number of Places Lived in the Last Year: 2     Unstable Housing in the Last Year: No       Family History   Problem Relation Age of Onset    Cancer Mother     Cancer Father     Hypertension Brother          Review of Systems:  Physical Exam:     Review of Systems   Constitutional:  Negative for fever.   Respiratory:  Negative for shortness of breath.    Cardiovascular:  Positive for chest pain.   Gastrointestinal:  Negative for abdominal pain, nausea and vomiting.   Skin:  Negative for rash.   Neurological:  Negative for syncope.         Pulse 80  BP 134/70  Resp 22  SpO2 93 %  Temp 97.6 F (36.4 C)     Physical Exam  Vitals and nursing note reviewed.   Constitutional:       General: He is not in acute distress.  HENT:      Head: Normocephalic and atraumatic.   Eyes:      Conjunctiva/sclera: Conjunctivae normal.   Cardiovascular:      Rate and Rhythm: Normal rate and regular rhythm.      Pulses: Normal pulses.      Heart sounds: Normal heart sounds.   Pulmonary:      Effort: Pulmonary effort is normal. No respiratory distress.      Breath sounds: Normal breath sounds.   Abdominal:      General: Bowel sounds are normal. There is no distension.      Palpations: Abdomen is soft.      Tenderness: There is no abdominal tenderness. There is no right CVA tenderness or left CVA tenderness.   Musculoskeletal:         General: Normal range of motion.      Cervical back: Normal range of motion and neck supple.      Right lower leg: No edema.      Left lower leg: No edema.   Skin:     General: Skin is warm and dry.      Findings: No rash.   Neurological:       General: No focal deficit present.      Mental Status: He is alert and oriented to person, place, and time.      Cranial Nerves: No cranial nerve deficit.               Clinical Decision Tools :              Results       Procedure Component Value Units Date/Time    High Sensitivity Troponin-I at 2 hrs with calculated Delta [id number] Collected: 10/19/21 0901    Specimen: Blood Updated: 10/19/21 0931     hs Troponin-I <2.7 ng/L      hs Troponin-I Delta Unable toCalc. ng/L     High Sensitivity Troponin-I [id number] Collected: 10/19/21 0652    Specimen: Blood Updated: 10/19/21 0728     hs Troponin-I 3.4 ng/L     Comprehensive metabolic panel [id number]  (Abnormal) Collected: 10/19/21 0652    Specimen: Blood Updated: 10/19/21 0727     Glucose 86 mg/dL      BUN 14.0 mg/dL      Creatinine 0.8 mg/dL      Sodium 137 mEq/L      Potassium 3.9 mEq/L      Chloride 103 mEq/L      CO2 24 mEq/L      Calcium 9.0 mg/dL      Protein, Total 8.2 g/dL      Albumin 4.0 g/dL      AST (SGOT) 15 U/L      ALT 18 U/L      Alkaline Phosphatase 75 U/L      Bilirubin, Total 0.7 mg/dL      Globulin 4.2 g/dL  Albumin/Globulin Ratio 1.0     Anion Gap 10.0     eGFR >60.0 mL/min/1.73 m2     D-Dimer [id number] Collected: 10/19/21 0652     Updated: 10/19/21 0712     D-Dimer 0.42 ug/mL FEU     CBC and differential [id number] Collected: 10/19/21 0652    Specimen: Blood Updated: 10/19/21 0706     WBC 4.46 x10 3/uL      Hgb 14.2 g/dL      Hematocrit 41.5 %      Platelets 175 x10 3/uL      RBC 4.69 x10 6/uL      MCV 88.5 fL      MCH 30.3 pg      MCHC 34.2 g/dL      RDW 13 %      MPV 9.5 fL      Instrument Absolute Neutrophil Count 2.58 x10 3/uL      Neutrophils 58.0 %      Lymphocytes Automated 25.3 %      Monocytes 8.7 %      Eosinophils Automated 6.5 %      Basophils Automated 1.3 %      Immature Granulocytes 0.2 %      Nucleated RBC 0.0 /100 WBC      Neutrophils Absolute 2.58 x10 3/uL      Lymphocytes Absolute Automated 1.13 x10 3/uL       Monocytes Absolute Automated 0.39 x10 3/uL      Eosinophils Absolute Automated 0.29 x10 3/uL      Basophils Absolute Automated 0.06 x10 3/uL      Immature Granulocytes Absolute 0.01 x10 3/uL      Absolute NRBC 0.00 x10 3/uL             Radiology Results (24 Hour)       Procedure Component Value Units Date/Time    US Venous Dopp Low Extrem Comp Bilat [id number] Collected: 10/19/21 1055    Order Status: Completed Updated: 10/19/21 1058    Narrative:      HISTORY: Chest pain.    COMPARISON: None.    TECHNIQUE: Pearline Cables scale, color flow, and spectral Doppler waveform analysis  was performed on the bilateral lower extremity veins described below. There  is normal compressibility, phasic flow, and response to augmentation unless  otherwise noted.    FINDINGS:   Right lower extremity:    Common femoral vein: Normal  Deep femoral vein (proximal portion): Normal  Greater saphenous vein at the saphenofemoral junction: Normal  Femoral vein: Normal  Popliteal vein: Normal  Posterior tibial veins: Normal  Peroneal veins: Normal    Left lower extremity:    Common femoral vein: Normal  Deep femoral vein (proximal portion): Normal  Greater saphenous vein at the saphenofemoral junction: Normal  Femoral vein: Normal  Popliteal vein: Normal  Posterior tibial veins: Normal  Peroneal veins: Normal      Impression:         No sonographic evidence for right or left lower extremity deep venous  thrombosis.    Nicholes Rough, MD  10/19/2021 10:56 AM    Chest AP Portable [id number] Collected: 10/19/21 0713    Order Status: Completed Updated: 10/19/21 0718    Narrative:      HISTORY: Chest pain.       COMPARISON: None available.    FINDINGS:     LUNGS: No consolidation or edema. Mild bibasilar atelectasis.    PLEURA: No pleural effusions or pneumothorax.    HEART  AND MEDIASTINUM:  No cardiac enlargement.    BONES:  Suboptimally evaluated.    Shunt catheter is visualized.      Impression:        Mild bibasilar atelectasis.    Noel Journey,  MD  10/19/2021 7:16 AM            DR. Cleotis Nipper  is the primary attending for this patient and performed the HPI, PE, and medical decision making for this patient.    *This note was generated by the Epic EMR system/Voice recognition system and may contain inherent errors or omissions not intended by the user. Grammatical errors, random word insertions, deletions, pronoun errors and incomplete sentences are occasional consequences of this technology due to software limitations. Not all errors are caught or corrected. If there are questions or concerns about the content of this note or information contained within the body of this dictation they should be addressed directly with the author for clarification        Procedures:   Procedures              Cleotis Nipper, MD  10/19/21 1117

## 2021-10-20 LAB — ECG 12-LEAD
Atrial Rate: 75 {beats}/min
IHS MUSE NARRATIVE AND IMPRESSION: NORMAL
IHS MUSE NARRATIVE AND IMPRESSION: NORMAL
P Axis: 3 degrees
Q-T Interval: 424 ms
Q-T Interval: 436 ms
QTC Calculation (Bezet): 486 ms
R Axis: -25 degrees
R Axis: -26 degrees
Ventricular Rate: 75 {beats}/min

## 2021-11-03 ENCOUNTER — Emergency Department: Payer: Medicare Other

## 2021-11-03 ENCOUNTER — Inpatient Hospital Stay
Admission: AC | Admit: 2021-11-03 | Discharge: 2021-11-05 | DRG: 183 | Disposition: A | Discharge status: Home Health | Payer: Medicare Other | Attending: Surgical Critical Care | Admitting: Surgical Critical Care

## 2021-11-03 ENCOUNTER — Inpatient Hospital Stay: Payer: Medicare Other

## 2021-11-03 DIAGNOSIS — I10 Essential (primary) hypertension: Secondary | ICD-10-CM | POA: Diagnosis present

## 2021-11-03 DIAGNOSIS — B9620 Unspecified Escherichia coli [E. coli] as the cause of diseases classified elsewhere: Secondary | ICD-10-CM | POA: Diagnosis present

## 2021-11-03 DIAGNOSIS — Z87891 Personal history of nicotine dependence: Secondary | ICD-10-CM

## 2021-11-03 DIAGNOSIS — S270XXA Traumatic pneumothorax, initial encounter: Secondary | ICD-10-CM

## 2021-11-03 DIAGNOSIS — R5381 Other malaise: Secondary | ICD-10-CM | POA: Diagnosis present

## 2021-11-03 DIAGNOSIS — S271XXA Traumatic hemothorax, initial encounter: Secondary | ICD-10-CM

## 2021-11-03 DIAGNOSIS — R4701 Aphasia: Secondary | ICD-10-CM | POA: Diagnosis not present

## 2021-11-03 DIAGNOSIS — Z7982 Long term (current) use of aspirin: Secondary | ICD-10-CM

## 2021-11-03 DIAGNOSIS — N3000 Acute cystitis without hematuria: Secondary | ICD-10-CM | POA: Diagnosis present

## 2021-11-03 DIAGNOSIS — Z982 Presence of cerebrospinal fluid drainage device: Secondary | ICD-10-CM

## 2021-11-03 DIAGNOSIS — I4891 Unspecified atrial fibrillation: Secondary | ICD-10-CM | POA: Diagnosis present

## 2021-11-03 DIAGNOSIS — I69390 Apraxia following cerebral infarction: Secondary | ICD-10-CM

## 2021-11-03 DIAGNOSIS — I639 Cerebral infarction, unspecified: Secondary | ICD-10-CM | POA: Diagnosis not present

## 2021-11-03 DIAGNOSIS — Z79899 Other long term (current) drug therapy: Secondary | ICD-10-CM

## 2021-11-03 DIAGNOSIS — R482 Apraxia: Secondary | ICD-10-CM | POA: Diagnosis not present

## 2021-11-03 DIAGNOSIS — I69320 Aphasia following cerebral infarction: Secondary | ICD-10-CM

## 2021-11-03 DIAGNOSIS — I69351 Hemiplegia and hemiparesis following cerebral infarction affecting right dominant side: Secondary | ICD-10-CM

## 2021-11-03 DIAGNOSIS — S272XXA Traumatic hemopneumothorax, initial encounter: Secondary | ICD-10-CM | POA: Diagnosis present

## 2021-11-03 DIAGNOSIS — I480 Paroxysmal atrial fibrillation: Secondary | ICD-10-CM | POA: Diagnosis present

## 2021-11-03 DIAGNOSIS — G912 (Idiopathic) normal pressure hydrocephalus: Secondary | ICD-10-CM | POA: Diagnosis present

## 2021-11-03 DIAGNOSIS — S2242XD Multiple fractures of ribs, left side, subsequent encounter for fracture with routine healing: Secondary | ICD-10-CM

## 2021-11-03 DIAGNOSIS — W1830XA Fall on same level, unspecified, initial encounter: Secondary | ICD-10-CM | POA: Diagnosis present

## 2021-11-03 DIAGNOSIS — R079 Chest pain, unspecified: Secondary | ICD-10-CM

## 2021-11-03 DIAGNOSIS — S2242XA Multiple fractures of ribs, left side, initial encounter for closed fracture: Principal | ICD-10-CM | POA: Diagnosis present

## 2021-11-03 DIAGNOSIS — E7849 Other hyperlipidemia: Secondary | ICD-10-CM | POA: Diagnosis present

## 2021-11-03 DIAGNOSIS — Z7901 Long term (current) use of anticoagulants: Secondary | ICD-10-CM

## 2021-11-03 DIAGNOSIS — W19XXXA Unspecified fall, initial encounter: Secondary | ICD-10-CM | POA: Diagnosis present

## 2021-11-03 HISTORY — DX: Multiple fractures of ribs, left side, subsequent encounter for fracture with routine healing: S22.42XD

## 2021-11-03 LAB — COMPREHENSIVE METABOLIC PANEL
ALT: 19 U/L (ref 0–55)
AST (SGOT): 23 U/L (ref 5–41)
Albumin/Globulin Ratio: 0.9 (ref 0.9–2.2)
Albumin: 3.8 g/dL (ref 3.5–5.0)
Alkaline Phosphatase: 71 U/L (ref 37–117)
Anion Gap: 10 (ref 5.0–15.0)
BUN: 13 mg/dL (ref 9.0–28.0)
Bilirubin, Total: 0.5 mg/dL (ref 0.2–1.2)
CO2: 22 mEq/L (ref 17–29)
Calcium: 8.9 mg/dL (ref 7.9–10.2)
Chloride: 107 mEq/L (ref 99–111)
Creatinine: 0.9 mg/dL (ref 0.5–1.5)
Globulin: 4.1 g/dL — ABNORMAL HIGH (ref 2.0–3.6)
Glucose: 95 mg/dL (ref 70–100)
Potassium: 4.5 mEq/L (ref 3.5–5.3)
Protein, Total: 7.9 g/dL (ref 6.0–8.3)
Sodium: 139 mEq/L (ref 135–145)
eGFR: >60 mL/min/{1.73_m2} (ref >=60)

## 2021-11-03 LAB — PT AND APTT
PT INR: 1.2 — ABNORMAL HIGH (ref 0.9–1.1)
PT: 13.4 s — ABNORMAL HIGH (ref 10.1–12.9)
PTT: 34 s (ref 27–39)

## 2021-11-03 LAB — RAPID DRUG SCREEN, URINE
Barbiturate Screen, UR: NEGATIVE
Benzodiazepine Screen, UR: NEGATIVE
Cannabinoid Screen, UR: NEGATIVE
Cocaine, UR: NEGATIVE
Opiate Screen, UR: POSITIVE — AB
PCP Screen, UR: NEGATIVE
Urine Amphetamine Screen: NEGATIVE
Urine Fentanyl: POSITIVE — AB

## 2021-11-03 LAB — CBC AND DIFFERENTIAL
Absolute NRBC: 0 10*3/uL (ref 0.00–0.00)
Basophils Absolute Automated: 0.06 10*3/uL (ref 0.00–0.08)
Basophils Automated: 0.8 %
Eosinophils Absolute Automated: 0.27 10*3/uL (ref 0.00–0.44)
Eosinophils Automated: 3.4 %
Hematocrit: 39.1 % (ref 37.6–49.6)
Hgb: 13.2 g/dL (ref 12.5–17.1)
Immature Granulocytes Absolute: 0.03 10*3/uL (ref 0.00–0.07)
Immature Granulocytes: 0.4 %
Instrument Absolute Neutrophil Count: 4.97 10*3/uL (ref 1.10–6.33)
Lymphocytes Absolute Automated: 1.79 10*3/uL (ref 0.42–3.22)
Lymphocytes Automated: 22.7 %
MCH: 30.3 pg (ref 25.1–33.5)
MCHC: 33.8 g/dL (ref 31.5–35.8)
MCV: 89.7 fL (ref 78.0–96.0)
MPV: 9.1 fL (ref 8.9–12.5)
Monocytes Absolute Automated: 0.78 10*3/uL (ref 0.21–0.85)
Monocytes: 9.9 %
Neutrophils Absolute: 4.97 10*3/uL (ref 1.10–6.33)
Neutrophils: 62.8 %
Nucleated RBC: 0 /100 WBC (ref 0.0–0.0)
Platelets: 156 10*3/uL (ref 142–346)
RBC: 4.36 10*6/uL (ref 4.20–5.90)
RDW: 13 % (ref 11–15)
WBC: 7.9 10*3/uL (ref 3.10–9.50)

## 2021-11-03 LAB — ECG 12-LEAD
Atrial Rate: 59 {beats}/min
P Axis: 16 degrees
P-R Interval: 166 ms
Q-T Interval: 460 ms
QRS Duration: 106 ms
QTC Calculation (Bezet): 455 ms
R Axis: -19 degrees
T Axis: -2 degrees
Ventricular Rate: 59 {beats}/min

## 2021-11-03 LAB — URINALYSIS REFLEX TO MICROSCOPIC EXAM - REFLEX TO CULTURE
Bilirubin, UA: NEGATIVE
Blood, UA: NEGATIVE
Glucose, UA: NEGATIVE
Nitrite, UA: POSITIVE — AB
Specific Gravity UA: 1.031 (ref 1.001–1.035)
Urine pH: 6.5 (ref 5.0–8.0)
Urobilinogen, UA: NORMAL mg/dL (ref 0.2–2.0)

## 2021-11-03 LAB — ABO/RH: ABO Rh: B POS

## 2021-11-03 LAB — ETHANOL: Alcohol: NOT DETECTED mg/dL

## 2021-11-03 LAB — MAGNESIUM: Magnesium: 2 mg/dL (ref 1.6–2.6)

## 2021-11-03 LAB — HIGH SENSITIVITY TROPONIN-I: hs Troponin-I: <2.7 ng/L

## 2021-11-03 LAB — TYPE AND SCREEN
AB Screen Gel: NEGATIVE
ABO Rh: B POS

## 2021-11-03 MED ORDER — STERILE WATER FOR INJECTION IJ/IV SOLN (WRAP)
1.0000 g | INTRAMUSCULAR | Status: DC
Start: 2021-11-03 — End: 2021-11-05
  Administered 2021-11-03 – 2021-11-04 (×2): 1 g via INTRAVENOUS
  Filled 2021-11-03 (×2): qty 1000

## 2021-11-03 MED ORDER — MAGNESIUM OXIDE 400 MG TABS (WRAP)
400.0000 mg | ORAL_TABLET | ORAL | Status: DC | PRN
Start: 2021-11-03 — End: 2021-11-05

## 2021-11-03 MED ORDER — POTASSIUM CHLORIDE 10 MEQ/100ML IV SOLN (WRAP)
10.0000 meq | INTRAVENOUS | Status: DC | PRN
Start: 2021-11-03 — End: 2021-11-05

## 2021-11-03 MED ORDER — KETOROLAC TROMETHAMINE 15 MG/ML IJ SOLN
15.0000 mg | Freq: Four times a day (QID) | INTRAMUSCULAR | Status: DC
Start: 2021-11-03 — End: 2021-11-05
  Administered 2021-11-03 – 2021-11-05 (×9): 15 mg via INTRAVENOUS
  Filled 2021-11-03 (×9): qty 1

## 2021-11-03 MED ORDER — FENTANYL CITRATE (PF) 50 MCG/ML IJ SOLN (WRAP)
25.0000 ug | Freq: Once | INTRAMUSCULAR | Status: AC
Start: 2021-11-03 — End: 2021-11-03
  Administered 2021-11-03: 25 ug via INTRAVENOUS
  Filled 2021-11-03: qty 1

## 2021-11-03 MED ORDER — ONDANSETRON HCL 4 MG/2ML IJ SOLN
4.0000 mg | Freq: Once | INTRAMUSCULAR | Status: AC
Start: 2021-11-03 — End: 2021-11-03
  Administered 2021-11-03: 4 mg via INTRAVENOUS
  Filled 2021-11-03: qty 2

## 2021-11-03 MED ORDER — METHOCARBAMOL 500 MG PO TABS
500.0000 mg | ORAL_TABLET | Freq: Three times a day (TID) | ORAL | Status: DC
Start: 2021-11-03 — End: 2021-11-05
  Administered 2021-11-03 – 2021-11-05 (×7): 500 mg via ORAL
  Filled 2021-11-03 (×7): qty 1

## 2021-11-03 MED ORDER — SODIUM PHOSPHATES 3 MMOLE/ML IV SOLN (WRAP)
35.0000 mmol | INTRAVENOUS | Status: DC | PRN
Start: 2021-11-03 — End: 2021-11-05

## 2021-11-03 MED ORDER — ATORVASTATIN CALCIUM 20 MG PO TABS
40.0000 mg | ORAL_TABLET | Freq: Every evening | ORAL | Status: DC
Start: 2021-11-03 — End: 2021-11-05
  Administered 2021-11-03 – 2021-11-04 (×2): 40 mg via ORAL
  Filled 2021-11-03 (×2): qty 2

## 2021-11-03 MED ORDER — AMLODIPINE BESYLATE 2.5 MG PO TABS
10.0000 mg | ORAL_TABLET | Freq: Every day | ORAL | Status: DC
Start: 2021-11-03 — End: 2021-11-05
  Administered 2021-11-03 – 2021-11-05 (×3): 10 mg via ORAL
  Filled 2021-11-03 (×3): qty 4

## 2021-11-03 MED ORDER — MORPHINE SULFATE 2 MG/ML IJ/IV SOLN (WRAP)
2.0000 mg | Freq: Once | Status: AC
Start: 2021-11-03 — End: 2021-11-03
  Administered 2021-11-03: 2 mg via INTRAVENOUS
  Filled 2021-11-03: qty 1

## 2021-11-03 MED ORDER — LIDOCAINE 5 % EX PTCH
2.0000 | MEDICATED_PATCH | CUTANEOUS | Status: DC
Start: 2021-11-04 — End: 2021-11-05
  Administered 2021-11-04 – 2021-11-05 (×2): 2 via TRANSDERMAL
  Filled 2021-11-03 (×2): qty 2

## 2021-11-03 MED ORDER — SENNOSIDES-DOCUSATE SODIUM 8.6-50 MG PO TABS
1.0000 | ORAL_TABLET | Freq: Two times a day (BID) | ORAL | Status: DC
Start: 2021-11-03 — End: 2021-11-05
  Administered 2021-11-03 – 2021-11-04 (×4): 1 via ORAL
  Filled 2021-11-03 (×4): qty 1

## 2021-11-03 MED ORDER — ACETAMINOPHEN 500 MG PO TABS
1000.0000 mg | ORAL_TABLET | Freq: Three times a day (TID) | ORAL | Status: DC
Start: 2021-11-03 — End: 2021-11-05
  Administered 2021-11-03 – 2021-11-05 (×7): 1000 mg via ORAL
  Filled 2021-11-03 (×7): qty 2

## 2021-11-03 MED ORDER — LIDOCAINE 5 % EX PTCH
1.0000 | MEDICATED_PATCH | CUTANEOUS | Status: DC
Start: 2021-11-03 — End: 2021-11-03
  Administered 2021-11-03: 1 via TRANSDERMAL
  Filled 2021-11-03: qty 1

## 2021-11-03 MED ORDER — HYDRALAZINE HCL 20 MG/ML IJ SOLN
10.0000 mg | Freq: Four times a day (QID) | INTRAMUSCULAR | Status: DC | PRN
Start: 2021-11-03 — End: 2021-11-05

## 2021-11-03 MED ORDER — ONDANSETRON HCL 4 MG PO TABS
4.0000 mg | ORAL_TABLET | Freq: Three times a day (TID) | ORAL | Status: DC | PRN
Start: 2021-11-03 — End: 2021-11-05

## 2021-11-03 MED ORDER — GABAPENTIN 100 MG PO CAPS
100.0000 mg | ORAL_CAPSULE | Freq: Three times a day (TID) | ORAL | Status: DC
Start: 2021-11-03 — End: 2021-11-05
  Administered 2021-11-03 – 2021-11-05 (×7): 100 mg via ORAL
  Filled 2021-11-03 (×7): qty 1

## 2021-11-03 MED ORDER — SODIUM PHOSPHATES 3 MMOLE/ML IV SOLN (WRAP)
25.0000 mmol | INTRAVENOUS | Status: DC | PRN
Start: 2021-11-03 — End: 2021-11-05

## 2021-11-03 MED ORDER — HYDROMORPHONE HCL 0.5 MG/0.5 ML IJ SOLN
0.2000 mg | INTRAMUSCULAR | Status: DC | PRN
Start: 2021-11-03 — End: 2021-11-05
  Administered 2021-11-03: 0.2 mg via INTRAVENOUS
  Filled 2021-11-03: qty 0.5

## 2021-11-03 MED ORDER — MAGNESIUM SULFATE IN D5W 1-5 GM/100ML-% IV SOLN
1.0000 g | INTRAVENOUS | Status: DC | PRN
Start: 2021-11-03 — End: 2021-11-05

## 2021-11-03 MED ORDER — ONDANSETRON HCL 4 MG/2ML IJ SOLN
4.0000 mg | Freq: Three times a day (TID) | INTRAMUSCULAR | Status: DC | PRN
Start: 2021-11-03 — End: 2021-11-05

## 2021-11-03 MED ORDER — POTASSIUM CHLORIDE CRYS ER 20 MEQ PO TBCR
0.0000 meq | EXTENDED_RELEASE_TABLET | ORAL | Status: DC | PRN
Start: 2021-11-03 — End: 2021-11-05
  Administered 2021-11-05: 20 meq via ORAL
  Filled 2021-11-03: qty 1

## 2021-11-03 MED ORDER — POTASSIUM CHLORIDE 20 MEQ PO PACK
0.0000 meq | PACK | ORAL | Status: DC | PRN
Start: 2021-11-03 — End: 2021-11-05

## 2021-11-03 MED ORDER — SODIUM PHOSPHATES 3 MMOLE/ML IV SOLN (WRAP)
15.0000 mmol | INTRAVENOUS | Status: DC | PRN
Start: 2021-11-03 — End: 2021-11-05

## 2021-11-03 MED ORDER — ENOXAPARIN SODIUM 40 MG/0.4ML IJ SOSY
40.0000 mg | PREFILLED_SYRINGE | Freq: Two times a day (BID) | INTRAMUSCULAR | Status: DC
Start: 2021-11-03 — End: 2021-11-05
  Administered 2021-11-03 – 2021-11-05 (×4): 40 mg via SUBCUTANEOUS
  Filled 2021-11-03 (×4): qty 0.4

## 2021-11-03 MED ORDER — OXYCODONE HCL 5 MG PO TABS
2.5000 mg | ORAL_TABLET | Freq: Four times a day (QID) | ORAL | Status: DC | PRN
Start: 2021-11-03 — End: 2021-11-05

## 2021-11-03 MED ORDER — OXYCODONE HCL 5 MG PO TABS
5.0000 mg | ORAL_TABLET | Freq: Four times a day (QID) | ORAL | Status: DC | PRN
Start: 2021-11-03 — End: 2021-11-05
  Administered 2021-11-03 – 2021-11-04 (×2): 5 mg via ORAL
  Filled 2021-11-03 (×2): qty 1

## 2021-11-03 NOTE — ED Notes (Signed)
Attempted to call daughter phone is turned off

## 2021-11-03 NOTE — Progress Notes (Signed)
NURSE NOTE SUMMARY  [facility]   Patient Name: [name]   Attending Physician: [name] Govern, MD   Today's date:   11/03/2021 LOS: 0 days   Shift Summary:                                                              Pt admitted onto unit ~12::30 from [place], report received from RN [name]. 4 eyes skin assessment completed as documented, pt oriented to room/board/plan. Rib fracture protocol in place.    AxO3-4 GCS 14-15, mild confusion on time occasionally. Maintaining SpO2 95% on 2L NC, hourly IS [id number]. Repeat CXR completed upon admission to unit, [name] [name] sent per order. SB/NSR on monitor, 50-60s, SBP 90-110s. Tolerating reg diet, ate 100% lunch and dinner tray. Bowel reg started.     Afebrile, Rocephin started d/t dirty UA. Lovenox 40 BID started for DVT proph. Ex cath in place, pt changed for incontinent episode onto pads upon admission to unit. PT/OT ordered to begin tomorrow. Pt called brother and updated him on events; attempted to call daughter (POA per paperwork from [name]), no answer at this time. Per pt, daughter out of town until the end of week.     Provider Notifications:   N/a     Rapid Response Notifications:  Mobility:   N/a   PMP Activity: Step 3 - Bed Mobility (11/03/2021  4:00 PM)     Weight tracking:  Family Dynamic:   Last 3 Weights for the past 72 hrs (Last 3 readings):   Weight   11/03/21 1226 83.7 kg (184 lb 8.4 oz)   11/03/21 0628 85 kg (187 lb 6.3 oz)             Last Bowel Movement   No data recorded

## 2021-11-03 NOTE — Plan of Care (Signed)
Problem: Moderate/High Fall Risk Score >5  Goal: Patient will remain free of falls  Outcome: Progressing     Problem: Safety  Goal: Patient will be free from injury during hospitalization  Outcome: Progressing  Goal: Patient will be free from infection during hospitalization  Outcome: Progressing     Problem: Pain  Goal: Pain at adequate level as identified by patient  Outcome: Progressing     Problem: Side Effects from Pain Analgesia  Goal: Patient will experience minimal side effects of analgesic therapy  Outcome: Progressing     Problem: Discharge Barriers  Goal: Patient will be discharged home or other facility with appropriate resources  Outcome: Progressing     Problem: Psychosocial and Spiritual Needs  Goal: Demonstrates ability to cope with hospitalization/illness  Outcome: Progressing     Problem: Compromised Hemodynamic Status  Goal: Vital signs and fluid balance maintained/improved  Outcome: Progressing     Problem: Inadequate Gas Exchange  Goal: Adequate oxygenation and improved ventilation  Outcome: Progressing  Goal: Patent Airway maintained  Outcome: Progressing     Problem: Inadequate Airway Clearance  Goal: Normal respiratory rate/effort achieved/maintained  Outcome: Progressing     Problem: Inadequate Tissue Perfusion-Venous  Goal: Tissue perfusion is adequate-venous  Outcome: Progressing     Problem: Impaired Mobility  Goal: Mobility/Activity is maintained at optimal level for patient  Outcome: Progressing     Problem: Nutrition  Goal: Nutritional intake is adequate  Outcome: Progressing

## 2021-11-03 NOTE — ED Triage Notes (Signed)
[name], [facility], due to increasing pain on the left side of the back. Fell down yesterday while he was trying to get up. Denies LOC and vomiting. Pain when being moved and deep breathing is at 9/10. When resting is 5/10. Denies any chest pain. With limited movement.

## 2021-11-03 NOTE — ED to IP RN Note (Signed)
[facility]  ED NURSING NOTE FOR THE RECEIVING INPATIENT NURSE   ED NURSE [place]   SPECTRALINK N/a   ED CHARGE RN Marylyn Ishihara   ADMISSION INFORMATION   [name] is a 76 y.o. male admitted with an ED diagnosis of:    1. Fall, initial encounter    2. Closed fracture of multiple ribs of left side, initial encounter    3. Traumatic pneumothorax, initial encounter         Isolation: None   Allergies: Ace inhibitors   Holding Orders confirmed? N/A   Belongings Documented? No   Home medications sent to pharmacy confirmed? N/A   NURSING CARE   Patient Comes From:   Mental Status: SNF  alert and anxious   ADL: Dependent with ADLs   Ambulation: 2 person assist   Pertinent Information  and Safety Concerns:     Broset Violence Risk Level: Low       CT / NIH   CT Head ordered on this patient?  Yes   NIH/Dysphagia assessment done prior to admission? N/A   VITAL SIGNS (at the time of this note)      Vitals:    11/03/21 0628   BP: 131/61   Pulse: 66   Resp: 17   Temp: 97.8 F (36.6 C)   SpO2: 96%

## 2021-11-03 NOTE — Progress Notes (Signed)
4 eyes in 4 hours pressure injury assessment note:      Completed with: Miguel, RN  Unit & Time admitted: [facility] @ 1220             Bony Prominences: Check appropriate box; if wound is present enter wound assessment in LDA     Occiput:                 [x]$ WNL  []$  Wound present  Face:                     [x]$ WNL  []$  Wound present  Ears:                      [x]$ WNL  []$  Wound present  Spine:                    [x]$ WNL  []$  Wound present  Shoulders:             [x]$ WNL  []$  Wound present  Elbows:                  [x]$ WNL  []$  Wound present  Sacrum/coccyx:     [x]$ WNL  []$  Wound present  Ischial Tuberosity:  [x]$ WNL  []$  Wound present  Trochanter/Hip:      [x]$ WNL  []$  Wound present  Knees:                   []$ WNL  []$  Wound present - scaling on R knee  Ankles:                   [x]$ WNL  []$  Wound present   Heels:                    []$ WNL  []$  Wound present -blanchable redness  Other pressure areas:  []$  Wound location       Device related: []$  Device name:         LDA completed if wound present: yes/no  Consult WOCN if necessary    Other skin related issues, ie tears, rash, etc, document in Integumentary flowsheet

## 2021-11-03 NOTE — ED Provider Notes (Addendum)
Nursing (triage) note reviewed for the following pertinent information:         History     Chief Complaint   Patient presents with    Rib Injury     The history is provided by the patient.        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  Patient states that he was trying to go to bed and he slept and hit his back fell could not get up had to get assistance he did get into his bed and was given Tylenol but this morning woke up in severe pain.  Severe pain with deep breaths.  He says that he did not hit his head.  Says that he tried to protect himself from hitting his head because of his VP shunt.  Says that he does not think he passed out but he is having some difficulty recalling the entire event.  He has not been sick recently.  Cannot recall ever fracturing ribs but has been in severe pain with any movement.    Past Medical History:   Diagnosis Date    Aphasia     Apraxia     Atrial fibrillation     Chronic pain     CVA (cerebral vascular accident)     Hemiplegia and hemiparesis following cerebral infarction affecting right dominant side     HLD (hyperlipidemia)     Major depression        Past Surgical History:   Procedure Laterality Date    VENTRICULOPERITONEAL SHUNT  2018    shunt revision 2022       Family History   Problem Relation Age of Onset    Cancer Mother     Cancer Father     Hypertension Brother        Social  Social History     Tobacco Use    Smoking status: Former     Types: Cigarettes     Quit date: 1971     Years since quitting: 52.9    Smokeless tobacco: Never   Vaping Use    Vaping Use: Never used   Substance Use Topics    Alcohol use: Not Currently    Drug use: Never       .     Allergies   Allergen Reactions    Ace Inhibitors       Other reaction(s): Cough       Home Medications       Med List Status: Complete Set By: Lunara, [place], RN at 11/03/2021  8:52 AM              acetaminophen (TYLENOL) 325 MG tablet     2 tablets (650 mg) every 4 (four) hours as needed     amLODIPine (NORVASC) 10 MG tablet     Take 1 tablet (10 mg) by mouth daily     aspirin EC 81 MG EC tablet     Take 1 tablet (81 mg) by mouth daily     atorvastatin (LIPITOR) 40 MG tablet     Take 1 tablet (40 mg) by mouth nightly     cyanocobalamin (VITAMIN B12) 500 MCG tablet     Take by mouth     rivaroxaban (XARELTO) 10  MG Tab     Take 1 tablet (10 mg) by mouth daily     vitamins/minerals Tab     Take 1 tablet by mouth daily             Review of Systems   Constitutional:  Negative for chills and fever.   Gastrointestinal:  Negative for diarrhea and vomiting.   Musculoskeletal:  Positive for back pain. Negative for neck pain.   Skin:  Negative for wound.       Physical Exam    BP: 131/61, Heart Rate: 66, Temp: 97.8 F (36.6 C), Resp Rate: 17, SpO2: 96 %, Weight: 85 kg    Physical Exam  Vitals and nursing note reviewed.   Constitutional:       General: He is not in acute distress.     Appearance: Normal appearance. He is well-developed. He is not ill-appearing or toxic-appearing.      Comments: Severe pain with any movement sitting up or trying to rotate from side to side extremely uncomfortable for the patient he still in good spirits sense of humor intact grimacing but smiling and conversant no increased work of breathing speaking in full sentences   HENT:      Head: Normocephalic and atraumatic.      Mouth/Throat:      Mouth: Mucous membranes are moist.   Eyes:      Conjunctiva/sclera: Conjunctivae normal.   Neck:      Comments: No midline C-spine tenderness  Cardiovascular:      Rate and Rhythm: Normal rate and regular rhythm.      Pulses: Normal pulses.      Heart sounds: Normal heart sounds.      Comments: There is no ecchymosis or erythema to the chest abdominal wall  or back  Pulmonary:      Effort: Pulmonary effort is normal.      Breath sounds: Normal breath sounds. No wheezing or rhonchi.   Abdominal:      General: There is no distension.      Palpations: Abdomen is soft.      Tenderness: There is no abdominal tenderness.   Musculoskeletal:      Cervical back: Normal range of motion.      Comments: He has no anterior lateral chest wall tenderness has a left scapular tenderness and the chest wall inferior to the scapula is also tender, denying mid thoracic and lumbar tenderness, there is no pain with range of motion of his bilateral lower extremities he is able to flex and extend at the bilateral hips he has full range of motion of his upper extremities as well without pain   Skin:     General: Skin is warm and dry.   Neurological:      Mental Status: He is alert and oriented to person, place, and time.   Psychiatric:         Mood and Affect: Mood normal.           MDM and ED Course     ED Medication Orders (From admission, onward)      Start Ordered     Status Ordering Provider    11/03/21 1141 11/03/21 1141    Every 2 hours PRN        Route: Intravenous  Ordered Dose: 0.2 mg       Discontinued MEZAACHE, HINDA Y    11/03/21 0805 11/03/21 0804  fentaNYL (PF) (SUBLIMAZE) injection 25 mcg  Once  Route: Intravenous  Ordered Dose: 25 mcg       Last MAR action: Given POTLURI, JAGADISH    11/03/21 0805 11/03/21 0804  ondansetron (ZOFRAN) injection 4 mg  Once        Route: Intravenous  Ordered Dose: 4 mg       Last MAR action: Given POTLURI, JAGADISH    11/03/21 0635 11/03/21 0634  morphine injection 2 mg  Once        Route: Intravenous  Ordered Dose: 2 mg       Last MAR action: Given Mirinda Monte A               Medical Decision Making  Amount and/or Complexity of Data Reviewed  Labs: ordered.  Radiology: ordered.  ECG/medicine tests: ordered.    Risk  Prescription drug management.  Decision regarding hospitalization.      76 year old male here in the emergency department  reporting a nonsyncopal fall trying to get into his bed last night has where had worse pain in his left posterior back  Patient is on anticoagulation -he denies this but Xarelto is listed in his medical record electronically and on his paper document from his living facility    Patient is hemodynamically stable he has no increased work of breathing is not hypoxic  Portable chest plain film with no evidence of pneumothorax  He is in severe pain with any range of motion of his chest and back having difficulty sitting up or rotating  Concern for spinal fracture rib fractures   He does have full range of motion of his left shoulder without severe pain making scapular fracture less likely  Because of his age and past medical history we will send basic labs we will send for CT head C-spine T-spine L-spine and a chest Noncon to evaluate for rib fractures  Patient has a benign abdominal exam  Do suspect this pain is related to the fall and not an acute medical emergency such as ACS dissection  We will give morphine for pain    EKG Interpretation  EKG interpreted independently by Dr. Elvina Mattes    Rate: Bradycardic  Rhythm: sinus bradycardia  Axis: Left  ST-T Segments: no st elevation  Conduction: No blocks  Impression: Non-specific EKG    No significant change from previous EKG on record.        Pertinent labs and imaging studies reviewed. (See chart for details)    ED Course as of 11/18/21 1924   Wed Nov 03, 2021   0655 Care transitioned to oncoming physician with labs and imaging pending [BL]   0804 Patient with multiple rib fractures on left side with tiny pneumothorax associated.  Discussed with Hinda NP covering for trauma services for admission.  Patient will be admitted to the [facility].  She will notify the [facility] attending. [JP]      ED Course User Index  [BL] Beverely Pace, MD  [JP] Regis Bill, MD       The primary encounter diagnosis was Fall, initial encounter. Diagnoses of Closed fracture of multiple ribs  of left side, initial encounter, Traumatic pneumothorax, initial encounter, and Acute cystitis without hematuria were also pertinent to this visit.    Number and Complexity of Problems Addressed (select at least one)    Complexity: High: 1 acute or chronic illness or injury that poses a threat to life or bodily function      Differential diagnosis: See MDM  Review of ED/outpatient records: Reviewed  patient's chart he has an outpatient office visit by neurosurgery from June of this year describing difficulty with balance and gait some residual right upper extremity weakness from previous stroke  Documented as VP shunt and that he follows with Dr. Dillard Cannon  Historians that case was discussed with: No yes portable chest x-ray  My EKG interpretation: If interpreted see interpretation above in ED course  Imaging interpretation by myself: Reviewed by me at the bedside there is no obvious pneumothorax on my read  Independent interpretation of labs by myself: not available  Discussion with other physicians/providers: n/a  Tests considered, but not ordered: no  Disposition decision making/shared decision making: no  Chronic conditions affecting care: yes on anticoagulation  Prescription medications given or considered: Morphine  Social determinants of health impacting treatment or disposition: No  Risk of Complications and/or Morbidity or Mortality of Patient Management: High  CODE STATUS and discussions: No    DR. Elvina Mattes is the primary attending for this patient and has obtained and performed the history, PE, and medical decision making for this patient.      Results       Procedure Component Value Units Date/Time    Urine culture [id number] Collected: 11/03/21 0945    Specimen: Bladder Updated: 11/05/21 0648    Narrative:      ORDER#: [id number]                                    ORDERED BY: Elvina Mattes  SOURCE: Urine                                        COLLECTED:  11/03/21 09:45  ANTIBIOTICS AT COLL.:                                 RECEIVED :  11/03/21 09:50  Culture Urine                              FINAL       11/05/21 06:48   +  11/05/21   >100,000 CFU/ML Escherichia coli      _____________________________________________________________________________                                     E.coli       ANTIBIOTICS                     MIC  INTRP      _____________________________________________________________________________  Amoxicillin/CA                  8/4    S        Ampicillin                      <=4    S  D1    Ampicillin/sulbactam            4/2    S        Aztreonam                       <=2  S        Cefazolin                       <=1    S  D2    Cefepime                        <=1    S        Cefoxitin                        8     S        Ceftazidime                     <=2    S        Ceftriaxone                     <=1    S  D3    Cefuroxime                      <=4    S        Ciprofloxacin                 <=0.25   S        Ertapenem                     <=0.25   S        Gentamicin                      <=2    S        Levofloxacin                   <=0.5   S        Meropenem                      <=0.5   S        Nitrofurantoin                  32     S  D4    Piperacillin/Tazobactam        <=2/4   S        Tetracycline                    <=2    S  D5    Trimethoprim/Sulfamethoxazole <=0.5/9  S          -----DRUG COMMENTS----------    D1:  If oral therapy is desired AND ampicillin is susceptible,         consider amoxicillin.         [place] [facility] Nov. 2020    D2:  Cefazolin interpretation is for uncomplicated UTI only.         If oral therapy is desired for uncomplicated UTI AND E.coli is         susceptible to cefazolin, consider cephalexin, cefdinir,         cefpodoxime, or cefprozil.         [place] [facility] Nov. 2020    D3:  Ceftriaxone does not predict cefdinir susceptibility.         [place] [facility] Nov. 2020    D4:  Nitrofurantoin should  only be used for the treatment of         uncomplicated cystitis.         [place] [facility] June 2015    D5:  Enterobacterales that are susceptible to tetracycline are         also considered susceptible to doxycycline and minocycline.         CLSI M100-ED33:2023  _____________________________________________________________________________            S=SUSCEPTIBLE     I=INTERMEDIATE     R=RESISTANT       N/S=NON-SUSCEPTIBLE     SDD=SUSCEPTIBLE-DOSE DEPENDENT  _____________________________________________________________________________      Basic Metabolic Panel [id number] Collected: 11/05/21 0331    Specimen: Blood Updated: 11/05/21 0403     Glucose 100 mg/dL      BUN 26.0 mg/dL      Creatinine 0.8 mg/dL      Calcium 8.7 mg/dL      Sodium 140 mEq/L      Potassium 3.9 mEq/L      Chloride 111 mEq/L      CO2 24 mEq/L      Anion Gap 5.0     eGFR >60.0 mL/min/1.73 m2     Narrative:      Trauma    Magnesium [id number] Collected: 11/05/21 0331    Specimen: Blood Updated: 11/05/21 0403     Magnesium 2.0 mg/dL     Narrative:      Trauma    Phosphorus [id number] Collected: 11/05/21 0331    Specimen: Blood Updated: 11/05/21 0403     Phosphorus 4.6 mg/dL     Narrative:      Trauma    CBC without differential [id number]  (Abnormal) Collected: 11/05/21 0331    Specimen: Blood Updated: 11/05/21 0342     WBC 5.80 x10 3/uL      Hgb 11.6 g/dL      Hematocrit 35.2 %      Platelets 133 x10 3/uL      RBC 3.82 x10 6/uL      MCV 92.1 fL      MCH 30.4 pg      MCHC 33.0 g/dL      RDW 13 %      MPV 9.5 fL      Nucleated RBC 0.0 /100 WBC      Absolute NRBC 0.00 x10 3/uL     Narrative:      Trauma            Radiology Results (24 Hour)       Procedure Component Value Units Date/Time    XR Chest AP Portable [id number] Collected: 11/05/21 [id number]    Order Status: Completed Updated: 11/05/21 0714    Narrative:      HISTORY: Left rib fractures. Evaluate for worsening pneumothorax,  hemothorax, pulmonary contusion.      COMPARISON: Portable chest 11/04/2021.    FINDINGS:   Right VP shunt catheter. Overlying monitor leads. A tiny left apical  pneumothorax is visualized as seen on chest CT 11/03/2021. Mild left  basilar atelectasis. No significant pleural effusion identified. Cardiac  silhouette and mediastinal contours are stable.      Impression:          1. Tiny left apical pneumothorax.  2. Mild left basilar atelectasis.    Loel Ro, MD  11/05/2021 7:12 AM            *This note was generated by the Epic EMR system/ Dragon speech recognition and may  contain inherent errors or omissions not intended by the user. Grammatical errors, random word insertions, deletions, pronoun errors and incomplete sentences are occasional consequences of this technology due to software limitations. Not all errors are caught or corrected. If there are questions or concerns about the content of this note or information contained within the body of this dictation they should be addressed directly with the author for clarification      ED Course as of 11/18/21 1924   Wed Nov 03, 2021   0655 Care transitioned to oncoming physician with labs and imaging pending [BL]   0804 Patient with multiple rib fractures on left side with tiny pneumothorax associated.  Discussed with Hinda NP covering for trauma services for admission.  Patient will be admitted to the [facility].  She will notify the [facility] attending. [JP]      ED Course User Index  [BL] Beverely Pace, MD  [JP] Regis Bill, MD             Procedures    Clinical Impression & Disposition     Clinical Impression  Final diagnoses:   Fall, initial encounter   Closed fracture of multiple ribs of left side, initial encounter   Traumatic pneumothorax, initial encounter        ED Disposition       ED Disposition   Admit    Condition   --    Date/Time   Wed Nov 03, 2021  8:20 AM    Comment   Admitting Physician: Orville Govern [id number]   Service:: Trauma [127]   Estimated Length of Stay: 3 - 5 Days    Tentative Discharge Plan?: [place] [3]                  Discharge Medication List as of 11/05/2021  1:59 PM                      Beverely Pace, MD  11/03/21 [place], [place], MD  11/07/21 [place], [place], MD  11/18/21 1924

## 2021-11-03 NOTE — H&P (Signed)
SURGICAL CRITICAL CARE HISTORY AND PHYSICAL     Date Time: 11/03/21 1:03 PM  Patient Name: [name],[name]  Attending Physician: Orville Govern, MD  Primary Care Physician: Jeanann Lewandowsky, MD  Type of Admission: Inpatient  DATE OF ADMISSION:   11/03/2021  6:26 AM  CHIEF COMPLAINT:     Chief Complaint   Patient presents with    Rib Injury      CONSULTS:   PM&R - Dr. Tomi Bamberger  ASSESSMENT / PLAN   The patient has the following active problems:  Patient Active Problem List   Diagnosis    NPH (normal pressure hydrocephalus)    S/P VP shunt    HLD (hyperlipidemia)    Fall, initial encounter       #Neuro:  Acute Post traumatic Pain  -multimodal pain control    CVA  -residual right sided deficitss and, aphasia and apraxia    NPH  -followed by [facility] neurosurgery and Dr. Dillard Cannon for neurology  -stable dilated ventricles w VP shunt in place  -last revised in 2022  GCS14    #Resp:  Left hydropneumothorax  -repeat XR shows slight increase in size, still w/o resp compromise  -am CXR or sooner for respiratory distress  -supplemental oxygen to maintain oxygen saturation >92%    #Cardio:  HLD  -con't home meds    Atrial Fibrillation  -hold xarelto  -not on rate control medications at home    Hypertension  -con't home meds w hold parameters    #GI:  Bowel regimen    #Infectious Disease (ID):  UTI  -Urine culture pending  - 5 day course of rocephin           #Hem/Onc: no active issues  -trend daily CBC    #Renal/Fluid, Electrolytes:  Urinary Tract Infection  -UA positive for leukocytes and nitrites  -follow up urine culture  -started on 5 day course of Rocephin (stop date ordered)    #Endo: no active issues  -goal blood sugar 140-180      #Vascular: no active issues    #Musculoskeletal:  Left sided Lateral Rib Fractures, 5-8  -multimodal pain management  -aggressive pulmonary hygiene, q1hr IS  -daily CXR     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  Diagnosis: Overweight based on BMI criteria       Present on Admission:  Fall/Trauma with injury    HPI:   [name] is a 76 y.o. male who  has a past medical history of Aphasia, Apraxia, Atrial fibrillation, Chronic pain, CVA (cerebral vascular accident), Hemiplegia and hemiparesis following cerebral infarction affecting right dominant side, HLD (hyperlipidemia), and Major depression., presents to the ER for evaluation after GLF in the bathroom. He was able to get to his bed with assistance, but today complained of severe pain to the left flank and back. At home he was given tylenol with no relief. Denies any sx of illness prior to the fall.  ALLERGIES:     Allergies   Allergen Reactions    Ace Inhibitors      Other reaction(s): Cough     MEDICATIONS:     Medications  Prior to Admission   Medication Sig    acetaminophen (TYLENOL) 325 MG tablet 2 tablets (650 mg) every 4 (four) hours as needed    amLODIPine (NORVASC) 10 MG tablet Take 1 tablet (10 mg) by mouth daily    aspirin EC 81 MG EC tablet Take 1 tablet (81 mg) by mouth daily    atorvastatin (LIPITOR) 40 MG tablet Take 1 tablet (40 mg) by mouth nightly    cyanocobalamin (VITAMIN B12) 500 MCG tablet Take by mouth    rivaroxaban (XARELTO) 10 MG Tab Take 1 tablet (10 mg) by mouth daily     PAST MEDICAL HISTORY:     Past Medical History:   Diagnosis Date    Aphasia     Apraxia     Atrial fibrillation     Chronic pain     CVA (cerebral vascular accident)     Hemiplegia and hemiparesis following cerebral infarction affecting right dominant side     HLD (hyperlipidemia)     Major depression      PAST SURGICAL HISTORY:     Past Surgical History:   Procedure Laterality Date    VENTRICULOPERITONEAL SHUNT  2018    shunt revision 2022     FAMILY HISTORY:     Family  History   Problem Relation Age of Onset    Cancer Mother     Cancer Father     Hypertension Brother      SOCIAL HISTORY:     Social History     Socioeconomic History    Marital status: Unknown     Spouse name: Not on file    Number of children: Not on file    Years of education: Not on file    Highest education level: Not on file   Occupational History    Not on file   Tobacco Use    Smoking status: Former     Types: Cigarettes     Quit date: 1971     Years since quitting: 52.8    Smokeless tobacco: Never   Vaping Use    Vaping Use: Never used   Substance and Sexual Activity    Alcohol use: Not Currently    Drug use: Never    Sexual activity: Not Currently   Other Topics Concern    Not on file   Social History Narrative    Not on file     Social Determinants of Health     Financial Resource Strain: Low Risk  (06/28/2021)    Overall Financial Resource Strain (CARDIA)     Difficulty of Paying Living Expenses: Not hard at all   Food Insecurity: No Food Insecurity (06/28/2021)    Hunger Vital Sign     Worried About Running Out of Food in the Last Year: Never true     Ran Out of Food in the Last Year: Never true   Transportation Needs: No Transportation Needs (06/28/2021)    PRAPARE - Armed forces logistics/support/administrative officer (Medical): No     Lack of Transportation (Non-Medical): No   Physical Activity: Unknown (06/28/2021)    Exercise Vital Sign     Days of Exercise per Week: 0 days     Minutes of Exercise per Session: Not on file   Stress: Stress Concern Present (06/28/2021)    [place]     Feeling of Stress : To some extent   Social Connections: Socially Isolated (06/28/2021)  Social Licensed conveyancer [NHANES]     Frequency of Communication with Friends and Family: Once a week     Frequency of Social Gatherings with Friends and Family: Once a week     Attends Religious Services: Never     Marine scientist or Organizations: No      Attends Archivist Meetings: Never     Marital Status: Widowed   Intimate Partner Violence: Not At Risk (06/28/2021)    Humiliation, Afraid, Rape, and Kick questionnaire     Fear of Current or Ex-Partner: No     Emotionally Abused: No     Physically Abused: No     Sexually Abused: No   Housing Stability: Low Risk  (06/28/2021)    Housing Stability Vital Sign     Unable to Pay for Housing in the Last Year: No     Number of Places Lived in the Last Year: 2     Unstable Housing in the Last Year: No     REVIEW OF SYSTEMS:   Pertinent positives as per HPI above  All other systems reviewed and are negative    PHYSICAL EXAM:     Vitals:    11/03/21 1130 11/03/21 1134 11/03/21 1226 11/03/21 1229   BP: 119/53 119/53  141/57   Pulse: 64 60  61   Resp: 14 16     Temp:  97.6 F (36.4 C)  97.5 F (36.4 C)   TempSrc:    Temporal   SpO2: 98% 100%  94%   Weight:   83.7 kg (184 lb 8.4 oz)    Height:   1.753 m (5' 9"$ )      Vent Settings:     Intake and Output Summary (Last 24 hours) at Date Time  No intake or output data in the 24 hours ending 11/03/21 1303    Physical Exam   Nursing note and vitals reviewed.   Constitutional: Pt appears well-developed and well-nourished.   HENT:     Head: Normocephalic and atraumatic.     Right Ear: External ear normal.     Left Ear: External ear normal.     Mouth/Throat: Moist mucous membranes. Uvula is midline. Oropharynx is clear. No oropharyngeal exudate.  Eyes: Conjunctivae normal. Pupils are equal, round, and reactive to light.   Neck: Normal range of motion.   Cardiovascular: Regular rate and rhythm. No murmur/rub/gallop. No pedal edema  Pulmonary/Chest: clear, diminished breath sounds throughout. No respiratory distress. O2 sat 97% on NC   Abdominal: Bowel sounds are normal. NT/ND. No hepato or splenomegaly  Pelvic/GU: Pelvis stable  MSK: MAEW. Palpable pulses in all 4 extremities. Capillary refill <2 seconds. PMS intact. No cyanosis or clubbing of the digits.  Neurological: Pt is  alert and oriented to person, place, and time. GCS 15. Normal speech.  Skin: Skin is warm and dry. Normal skin turgor.   Psychiatric: Normal mood and affect. Behavior is normal.       Patient Lines/Drains/Airways Status       Active PICC Line / CVC Line / PIV Line / Drain / Airway / Intraosseous Line / Epidural Line / ART Line / Line / Wound / Pressure Ulcer / NG/OG Tube       Name Placement date Placement time Site Days    Peripheral IV 11/03/21 18 G Standard Anterior;Right Wrist 11/03/21  0645  Wrist  less than 1    Peripheral IV 11/03/21 22 G Diffusion Left Antecubital 11/03/21  0840  Antecubital  less than 1    External Urinary Catheter 11/03/21  --  --  less than 1                    LABS:   ETOH: not detected  Results       Procedure Component Value Units Date/Time    ABO/Rh [id number] Collected: 11/03/21 1239    Specimen: Blood Updated: 11/03/21 1240    Rapid drug screen, urine [id number]  (Abnormal) Collected: 11/03/21 0945    Specimen: Urine Updated: 11/03/21 1004     Urine Amphetamine Screen Negative     Barbiturate Screen, UR Negative     Benzodiazepine Screen, UR Negative     Cannabinoid Screen, UR Negative     Cocaine, UR Negative     Urine Fentanyl Positive     Opiate Screen, UR Positive     PCP Screen, UR Negative    Urinalysis Reflex to Microscopic Exam- Reflex to Culture [id number]  (Abnormal) Collected: 11/03/21 0945    Specimen: Urine, Clean Catch Updated: 11/03/21 0954     Urine Type Urine, Clean Ca     Color, UA Yellow     Clarity, UA Hazy     Specific Gravity UA 1.031     Urine pH 6.5     Leukocyte Esterase, UA Moderate     Nitrite, UA Positive     Protein, UR 30= 1+     Glucose, UA Negative     Ketones UA Trace     Urobilinogen, UA Normal mg/dL      Bilirubin, UA Negative     Blood, UA Negative     RBC, UA 0-2 /hpf      WBC, UA TNTC /hpf     Ethanol (Alcohol) Level [id number] Collected: 11/03/21 0654    Specimen: Blood Updated: 11/03/21 0905     Alcohol NONE DETECTED mg/dL     PT/APTT  [id number]  (Abnormal) Collected: 11/03/21 0654     Updated: 11/03/21 0819     PT 13.4 sec      PT INR 1.2     PTT 34 sec     High Sensitivity Troponin-I [id number] Collected: 11/03/21 0654    Specimen: Blood Updated: 11/03/21 0720     hs Troponin-I <2.7 ng/L     Comprehensive metabolic panel [id number]  (Abnormal) Collected: 11/03/21 0654    Specimen: Blood Updated: 11/03/21 0715     Glucose 95 mg/dL      BUN 13.0 mg/dL      Creatinine 0.9 mg/dL      Sodium 139 mEq/L      Potassium 4.5 mEq/L      Chloride 107 mEq/L      CO2 22 mEq/L      Calcium 8.9 mg/dL      Protein, Total 7.9 g/dL      Albumin 3.8 g/dL      AST (SGOT) 23 U/L      ALT 19 U/L      Alkaline Phosphatase 71 U/L      Bilirubin, Total 0.5 mg/dL      Globulin 4.1 g/dL      Albumin/Globulin Ratio 0.9     Anion Gap 10.0     eGFR >60.0 mL/min/1.73 m2     Magnesium [id number] Collected: 11/03/21 0654    Specimen: Blood Updated: 11/03/21 0715     Magnesium 2.0 mg/dL     CBC and differential [id number] Collected: 11/03/21 0654  Specimen: Blood Updated: 11/03/21 0700     WBC 7.90 x10 3/uL      Hgb 13.2 g/dL      Hematocrit 39.1 %      Platelets 156 x10 3/uL      RBC 4.36 x10 6/uL      MCV 89.7 fL      MCH 30.3 pg      MCHC 33.8 g/dL      RDW 13 %      MPV 9.1 fL      Instrument Absolute Neutrophil Count 4.97 x10 3/uL      Neutrophils 62.8 %      Lymphocytes Automated 22.7 %      Monocytes 9.9 %      Eosinophils Automated 3.4 %      Basophils Automated 0.8 %      Immature Granulocytes 0.4 %      Nucleated RBC 0.0 /100 WBC      Neutrophils Absolute 4.97 x10 3/uL      Lymphocytes Absolute Automated 1.79 x10 3/uL      Monocytes Absolute Automated 0.78 x10 3/uL      Eosinophils Absolute Automated 0.27 x10 3/uL      Basophils Absolute Automated 0.06 x10 3/uL      Immature Granulocytes Absolute 0.03 x10 3/uL      Absolute NRBC 0.00 x10 3/uL           RADS:   Radiological Procedure personally  reviewed by this surgeon    CT L- Spine without Contrast    Result  Date: 11/03/2021   1.Chronic appearing compression fractures at L1 and L2. 2. No acute fractures are seen. 3. Degenerative facet changes are noted with canal stenosis at L3-L4 and L4-L5. Oren Binet, MD 11/03/2021 7:50 AM    CT Chest without Contrast    Result Date: 11/03/2021  1.Mildly displaced left 5-8 rib fractures with trace left hydropneumothorax and dependent atelectasis. 2.No acute fracture or traumatic subluxation of the thoracic spine. 3.Additional chronic and incidental findings are detailed above. Blanchard Mane 11/03/2021 7:49 AM    CT Reconstruction T-spine    Result Date: 11/03/2021  1.Mildly displaced left 5-8 rib fractures with trace left hydropneumothorax and dependent atelectasis. 2.No acute fracture or traumatic subluxation of the thoracic spine. 3.Additional chronic and incidental findings are detailed above. 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 2. There is no herniation or intracranial hemorrhage. 3.There is no definite acute intracranial abnormality. 4. Old left frontal infarct. Oren Binet, MD 11/03/2021 7:39 AM    CT Cervical Spine without Contrast    Result Date: 11/03/2021   1.No acute fracture or malalignment. 2.Tiny left apical pneumothorax. Please refer to the separately dictated CT of the chest. 3.Right mastoid effusion. Blanchard Mane 11/03/2021 7:38 AM    XR Chest  AP Portable    Result Date: 11/03/2021   1.No definite acute traumatic abnormality. 2. The lungs are clear. There is no detectable pneumothorax. Oren Binet, MD 11/03/2021 7:10 AM     EKG Results       Procedure Component Value Units Date/Time    ECG 12 lead [id number] Collected: 11/03/21 0659     Updated: 11/03/21 0659     Ventricular Rate 59 BPM      Atrial Rate 59 BPM      P-R Interval 166 ms  QRS Duration 106 ms      Q-T Interval 460 ms      QTC Calculation (Bezet) 455 ms      P Axis 16 degrees      R Axis -19 degrees      T Axis -2 degrees       IHS MUSE NARRATIVE AND IMPRESSION --     SINUS BRADYCARDIA  MODERATE VOLTAGE CRITERIA FOR LVH, MAY BE NORMAL VARIANT ( R in aVL , [employer] product )  BORDERLINE NORMAL/ABNORMAL ELECTROCARDIOGRAM  WHEN COMPARED WITH ECG OF 19-Oct-2021 07:29,  NONSPECIFIC T WAVE ABNORMALITY NO LONGER EVIDENT IN ANTERIOR LEADS      Narrative:      SINUS BRADYCARDIA  MODERATE VOLTAGE CRITERIA FOR LVH, MAY BE NORMAL VARIANT ( R in aVL , [employer] product )  BORDERLINE NORMAL/ABNORMAL ELECTROCARDIOGRAM  WHEN COMPARED WITH ECG OF 19-Oct-2021 07:29,  NONSPECIFIC T WAVE ABNORMALITY NO LONGER EVIDENT IN ANTERIOR LEADS            I have personally reviewed the patient's history and 24 hour interval events, along with vitals, labs, radiology images and nursing notes, as well as the medical record from the previous hospitalizations, and outside records available. So far today I have spent 85 minutes providing care for this patient excluding teaching and billable procedures, and not overlapping with any other providers with greater than 50% of the time in counseling or coordination of care. .    See above number of minutes of critical care time, excluding procedures, with high imminent risk for threat to life or limb with the following systems at risk:  Rib fractures w hydropneumothorax and Acute respiratory insufficiency  Activities of critical care included:  Consults, Discussion with patient/family, Review of prior records, Review of results in the ED, Bedside evaluation and direction of care, High risk medication administration or other resuscitation, Respiratory support, and Close observation and re-evaluations    Critical care time was exclusive of separately billable procedures and treating other patients and teaching time.   Critical care was time spent personally by me on one or more of the following activities: discussions with consultants, development of treatment plan with patient or surrogate, evaluation of patient's response to  treatment, examination of patient, obtaining history from patient or surrogate, ordering and performing treatments and interventions, ordering and review of laboratory studies, ordering and review of radiographic studies, pulse oximetry, re-evaluation of patient's condition and review of old charts.   At least one organ system is acutely impaired.   There is a high probability of imminent, life-threatening deterioration.   I intervened to try to prevent further deterioration of the patient's condition.   SIGNED BY:    Wyonia Hough, NP    *This note was generated by the Epic EMR system/ Dragon speech recognition and may contain inherent errors or omissions not intended by the user. Grammatical errors, random word insertions, deletions, pronoun errors and incomplete sentences are occasional consequences of this technology due to software limitations. Not all errors are caught or corrected. If there are questions or concerns about the content of this note or information contained within the body of this dictation they should be addressed directly with the author for clarification    *Due to pandemic of coronavirus and based on hospital policies and procedures ; this document may contain, but not limited to, information based on direct patient contact, observation , telephone conversation, use of A/V devices , medical records, conversation with other treatment team members,  information and physical exam by other treatment team members.  Not all errors are caught or corrected. If there are questions or concerns about the content of this note or information contained within the body of this document they should be addressed directly with the author for clarification.

## 2021-11-04 ENCOUNTER — Inpatient Hospital Stay: Payer: Medicare Other

## 2021-11-04 LAB — BASIC METABOLIC PANEL
Anion Gap: 10 (ref 5.0–15.0)
BUN: 25 mg/dL (ref 9.0–28.0)
CO2: 22 mEq/L (ref 17–29)
Calcium: 8.5 mg/dL (ref 7.9–10.2)
Chloride: 107 mEq/L (ref 99–111)
Creatinine: 1 mg/dL (ref 0.5–1.5)
Glucose: 111 mg/dL — ABNORMAL HIGH (ref 70–100)
Potassium: 4.1 mEq/L (ref 3.5–5.3)
Sodium: 139 mEq/L (ref 135–145)
eGFR: >60 mL/min/{1.73_m2} (ref >=60)

## 2021-11-04 LAB — CBC
Absolute NRBC: 0 10*3/uL (ref 0.00–0.00)
Hematocrit: 34.8 % — ABNORMAL LOW (ref 37.6–49.6)
Hgb: 11.5 g/dL — ABNORMAL LOW (ref 12.5–17.1)
MCH: 30.3 pg (ref 25.1–33.5)
MCHC: 33 g/dL (ref 31.5–35.8)
MCV: 91.8 fL (ref 78.0–96.0)
MPV: 9.9 fL (ref 8.9–12.5)
Nucleated RBC: 0 /100 WBC (ref 0.0–0.0)
Platelets: 135 10*3/uL — ABNORMAL LOW (ref 142–346)
RBC: 3.79 10*6/uL — ABNORMAL LOW (ref 4.20–5.90)
RDW: 13 % (ref 11–15)
WBC: 7.63 10*3/uL (ref 3.10–9.50)

## 2021-11-04 LAB — MAGNESIUM: Magnesium: 2.1 mg/dL (ref 1.6–2.6)

## 2021-11-04 LAB — PHOSPHORUS: Phosphorus: 5 mg/dL — ABNORMAL HIGH (ref 2.3–4.7)

## 2021-11-04 MED ORDER — TAMSULOSIN HCL 0.4 MG PO CAPS
0.4000 mg | ORAL_CAPSULE | Freq: Every day | ORAL | Status: DC
Start: 2021-11-04 — End: 2021-11-05
  Administered 2021-11-04: 0.4 mg via ORAL
  Filled 2021-11-04: qty 1

## 2021-11-04 MED ORDER — RISAQUAD PO CAPS
1.0000 | ORAL_CAPSULE | Freq: Every day | ORAL | Status: DC
Start: 2021-11-04 — End: 2021-11-05
  Administered 2021-11-04 – 2021-11-05 (×2): 1 via ORAL
  Filled 2021-11-04 (×2): qty 1

## 2021-11-04 NOTE — Plan of Care (Signed)
Problem: Moderate/High Fall Risk Score >5  Goal: Patient will remain free of falls  Outcome: Progressing  Flowsheets (Taken 11/04/2021 2000)  High (Greater than 13):   HIGH-Visual cue at entrance to patient's room   HIGH-Bed alarm on at all times while patient in bed   HIGH-Utilize chair pad alarm for patient while in the chair   HIGH-Apply yellow "Fall Risk" arm band   HIGH-Initiate use of floor mats as appropriate   HIGH-Consider use of low bed     Problem: Safety  Goal: Patient will be free from injury during hospitalization  Outcome: Progressing  Flowsheets (Taken 11/03/2021 2358)  Patient will be free from injury during hospitalization:   Provide and maintain safe environment   Assess patient's risk for falls and implement fall prevention plan of care per policy   Use appropriate transfer methods   Ensure appropriate safety devices are available at the bedside   Include patient/ family/ care giver in decisions related to safety   Assess for patients risk for elopement and implement [place] per policy   Hourly rounding   Provide alternative method of communication if needed (communication boards, writing)  Goal: Patient will be free from infection during hospitalization  Outcome: Progressing  Flowsheets (Taken 11/03/2021 2358)  Free from Infection during hospitalization:   Assess and monitor for signs and symptoms of infection   Monitor lab/diagnostic results   Monitor all insertion sites (i.e. indwelling lines, tubes, urinary catheters, and drains)   Encourage patient and family to use good hand hygiene technique     Problem: Pain  Goal: Pain at adequate level as identified by patient  Outcome: Progressing  Flowsheets (Taken 11/03/2021 2358)  Pain at adequate level as identified by patient:   Identify patient comfort function goal   Assess for risk of opioid induced respiratory depression, including snoring/sleep apnea. Alert healthcare team of risk factors identified.   Assess pain on admission,  during daily assessment and/or before any "as needed" intervention(s)   Reassess pain within 30-60 minutes of any procedure/intervention, per Pain Assessment, Intervention, Reassessment (AIR) Cycle   Evaluate if patient comfort function goal is met   Evaluate patient's satisfaction with pain management progress   Offer non-pharmacological pain management interventions   Include patient/patient care companion in decisions related to pain management as needed     Problem: Side Effects from Pain Analgesia  Goal: Patient will experience minimal side effects of analgesic therapy  Outcome: Progressing  Flowsheets (Taken 11/03/2021 2358)  Patient will experience minimal side effects of analgesic therapy:   Monitor/assess patient's respiratory status (RR depth, effort, breath sounds)   Assess for changes in cognitive function   Prevent/manage side effects per LIP orders (i.e. nausea, vomiting, pruritus, constipation, urinary retention, etc.)   Evaluate for opioid-induced sedation with appropriate assessment tool (i.e. POSS)     Problem: Inadequate Gas Exchange  Goal: Adequate oxygenation and improved ventilation  Outcome: Progressing  Flowsheets (Taken 11/03/2021 2358)  Adequate oxygenation and improved ventilation:   Assess lung sounds   Monitor SpO2 and treat as needed   Monitor and treat ETCO2   Provide mechanical and oxygen support to facilitate gas exchange   Position for maximum ventilatory efficiency   Teach/reinforce use of incentive spirometer 10 times per hour while awake, cough and deep breath as needed   Plan activities to conserve energy: plan rest periods   Increase activity as tolerated/progressive mobility   Consult/collaborate with Respiratory Therapy  Goal: Patent Airway maintained  Outcome:  Progressing  Flowsheets (Taken 11/03/2021 2358)  Patent airway maintained:   Position patient for maximum ventilatory efficiency   Provide adequate fluid intake to liquefy secretions   Reinforce use of ordered respiratory  interventions (i.e. CPAP, BiPAP, Incentive Spirometer, Acapella, etc.)   Suction secretions as needed   Reposition patient every 2 hours and as needed unless able to self-reposition

## 2021-11-04 NOTE — Progress Notes (Signed)
Initial Case Management Assessment and Discharge [place]   Patient Name: [name], [name]   Date of Birth 23-Oct-1945   Attending Physician: Orville Govern, MD   Primary Care Physician: Mahala Menghini, MD   Length of Stay 1   Reason for Consult / Chief Complaint 76 yo M admitted s/p fall w/ multiple rib fxs & pneumothorax. PT OT pending.         Situation   Admission DX:   1. Fall, initial encounter    2. Closed fracture of multiple ribs of left side, initial encounter    3. Traumatic pneumothorax, initial encounter        Patient admitted from: ER  Admission Status: inpatient    Health Care Agent: Child  Name / phone number:      Background     Advanced directive:   <no information>    Code Status:   Full Code     Residence: Other: [place]    PCP: Mahala Menghini, MD  Patient Contact:   [phone] (home)     [phone] (mobile)   Emergency contact:   Extended Emergency Contact Information  Primary Emergency Contact: [place]  Mobile Phone: [phone]  Relation: Daughter  Secondary Emergency Contact: [facility] Phone: [phone]  Relation: Son in law   ADL/IADL's: Assistive Device and Needs Assist  Previous Level of function: 3 Moderate Assist and 2 Maximal Assist    DME: Wheelchair - Armed forces training and education officer bars    Pharmacy:     [facility], [place]  [place] [address]  Phone: [phone] Fax: [phone]      Prescription Coverage: Yes    Home Health: The patient is not currently receiving home health services.    Previous SNF/AR: [facility]    COVID Vaccine Status: x3 or 4    Transport for discharge? Mode of transportation: TBD closer to [place]   Assessment   Spoke with pt dtr, Cyril Mourning, via telephone for CM routine screening, assessment and care coordination for discharge planning, demographics & pharmacy verified. Pt lives in assisted living @ [facility]. PTA he was needing assistance with most ADLs  and dependent upon others for his IADLs. Pts PCP is Dr. Charlyne Mom, he was seen by the NP within the last few weeks. PT OT pending. Daughters choices for rehab are: 1) [facility], 2) [facility]. Daughter is out of state for a business trip, should pt have immediate needs, his son in law, Shanon Brow, is in the area and can be reached at the number on profile. Transportation to be discussed closer to [place] based on pts comfort level. All questions answered prior to ending call w/ pts daughter. The above information has been passed onto pts on-site CM.   BARRIERS TO DISCHARGE: none at this time   Recommendation   [facility]. CM will continue to follow.      Amedeo Kinsman, BSN RN  Case Management  [facility]  [address] [place]  [place], [place] [address]  T [phone]  F [phone]

## 2021-11-04 NOTE — PT Eval Note (Signed)
Physical Therapy Evaluation  Patient: [name]    ST04/ST04.A  Discharge Recommendations:   Based on today's session: ALF with home health PT   If pt returns home, DME Recommended for Discharge: Patient already has needed equipment      Assessment:   [name] is a 76 y.o. male admitted 11/03/2021.  Pt's functional mobility is impacted by:  decreased activity tolerance, decreased balance, and gait impairment.  There are a few comorbidities or other factors that affect plan of care and require modification of task including: assistive device needed for mobility, residual neurological symptoms, and PMH.  Standardized tests and exams incorporated into evaluation include AMPAC mobility.  Pt demonstrates a stable clinical presentation due to no adverse response to activity.   Pt would continue to benefit from PT to address these deficits and increase functional independence.     Complexity Level Hx and Co  morbidites Examination Clinical Decision Making Clinical Presentation   Low no impact 1-2 elements Limited options Stable   Moderate   1-2 factors 3 or more   Several options Evolving, plan may alter   High 3 or more 4 or more Multiple options Unstable, unpredictable       Impairments: Assessment: Decreased endurance/activity tolerance;Decreased functional mobility.     Therapy Diagnosis: decreased functional mobility  and decreased endurance/ activity engagement due to fall with rib fx. Without therapy interventions, patient is at risk for falls, dependence on caregivers for mobility, and dependence on caregivers for ADL's.    Rehabilitation Potential: Prognosis: With continued PT status post acute discharge      Plan:    Treatment/Interventions: Gait training;Neuromuscular re-education;Functional transfer training;LE strengthening/ROM;Endurance training;Bed mobility PT Frequency: 2-3x/wk    Risks/Benefits/POC Discussed with Pt/Family: With patient          Goals:   Goals  Goal Formulation: With  patient  Time for Goal Acheivement: By time of discharge  Goals: Select goal  Pt Will Go Supine To Sit: with minimal assist;to maximize functional mobility and independence;5 visits  Pt Will Perform Sit to Stand: with supervision;to maximize functional mobility and independence;5 visits  Pt Will Ambulate: 51-100 feet;with rolling walker;with minimal assist;to maximize functional mobility and independence;5 visits       [place] ST04/ST04.A    PT Received On: 11/04/21  Start Time: 0919  Stop Time: 0937  Time Calculation (min): 18 min    PT Visit Number: 1    Consult received for [name] for PT Evaluation and Treatment.  Patient's medical condition is appropriate for Physical therapy intervention at this time.    Precautions and Contraindications:   Precautions  Other Precautions: Falls, L rib fractures, hx of CVA with R sided weakness    Medical Diagnosis: Traumatic pneumothorax, initial encounter [S27.0XXA]  Fall, initial encounter [id number]]  Closed fracture of multiple ribs of left side, initial encounter [S22.42XA]    History of Present Illness: [name] is a 76 y.o. male admitted on 11/03/2021 with fall resulting in L sided rib fx and possible hydropneumothorax    Patient Active Problem List   Diagnosis    NPH (normal pressure hydrocephalus)    S/P VP shunt    HLD (hyperlipidemia)    Fall, initial encounter    Closed fracture of multiple ribs of left side with routine healing    Primary hypertension    Other hyperlipidemia    Atrial fibrillation    CVA (cerebral vascular accident)    Aphasia  Apraxia        Past Medical/Surgical History:  Past Medical History:   Diagnosis Date    Aphasia     Apraxia     Atrial fibrillation     Chronic pain     CVA (cerebral vascular accident)     Hemiplegia and hemiparesis following cerebral infarction affecting right dominant side     HLD (hyperlipidemia)     Major depression       Past Surgical History:   Procedure  Laterality Date    VENTRICULOPERITONEAL SHUNT  2018    shunt revision 2022         X-Rays/Tests/Labs:  XR Chest AP Portable    Result Date: 11/04/2021  No acute cardiopulmonary disease. Ardelia Mems, MD 11/04/2021 7:47 AM    XR Chest AP Portable    Result Date: 11/03/2021  Left-sided rib fractures, not significantly displaced. No new pneumothorax. No visible effusion by x-ray. Elpidio Eric, MD 11/03/2021 5:43 PM     Social History:  Prior Level of Function  Prior level of function: Ambulates with assistive device, Up to chair with assistance, Needs assistance with ADLs  Assistive Device: Front wheel walker, Wheelchair (Per patient report, he was able to ambulate 200 feet with assistance from PT and RW prior to hospitalization. Patient typically uses wheelchair for functional mobility.)  DME Currently at Home: Gilford Rile, [employer], Radio broadcast assistant, Manual  Home Living Arrangements  Living Arrangements: Alone Engineer, civil (consulting))  Type of Home: Assisted living [place])  Home Layout: One level, Performs ADL's on one level, Marketing executive Shower/Tub: Tourist information centre manager: Raised  Bathroom Equipment: Grab bars in shower, Built-in shower seat, Hand-held shower  DME Currently at Home: Environmental consultant, [employer], Wheelchair, Manual      Subjective:    Patient is agreeable to participation in the therapy session. Nursing clears patient for therapy. C/o pain L side due to rib fx. RN reports pt recently had pain medication.   Patient Goal  Patient Goal: To go home       Objective:   Observation of Patient/Vital Signs:  Patient is in bed with telemetry and O2 via nasal cannula, male external catheter in place.    Cognition/Neuro Status  Arousal/Alertness: Appropriate responses to stimuli  Attention Span: Appears intact  Orientation Level: Oriented X4  Memory: Appears intact  Following Commands: Follows all commands and directions without difficulty  Safety Awareness: minimal verbal instruction  Insights: Decreased awareness of  deficits  Behavior: attentive;calm;cooperative  Coordination: [place] impaired;[facility] impaired (R impaired)    Gross ROM  Right Lower Extremity ROM: within functional limits (Noted increased tone with AAROM/PROM)  Left Lower Extremity ROM: within functional limits  Gross Strength  Right Lower Extremity Strength: 4/5  Left Lower Extremity Strength: 4/5       Functional Mobility  Supine to Sit: Moderate Assist;HOB raised (assistance for trunk elevation)  Sit to Stand: Minimal Assist;with instruction for hand placement to increase safety  Stand to Sit: Minimal Assist  Transfers  Bed to Chair: Minimal Assist (with RW, stand stepping)        PMP Activity: Step 5 - Chair     Balance  Balance: needs focused assessment  Sitting - Static: Good  Sitting - Dynamic: Good  Standing - Static: Fair  Standing - Dynamic: Fair    Participation and Endurance  Participation Effort: good  Endurance: Tolerates 10 - 20 min exercise with multiple rests. Reported lightheadedness when first sat EOB, improved with time. No further complaints.  AM-PACT Inpatient Short Forms  Inpatient AM-PACT Performed? (PT): Basic Mobility Inpatient Short Form  AM-PACT "6 Clicks" Basic Mobility Inpatient Short Form  Turning Over in Bed: A little  Sitting Down On/Standing From Armchair: A little  Lying on Back to Sitting on Side of Bed: A lot  Assist Moving to/from Bed to Chair: A little  Assist to Walk in Hospital Room: A little  Assist to Climb 3-5 Steps with Railing: A lot  PT Basic Mobility Raw Score: 16  CMS 0-100% Score: 54.16%    Treatment Activities: Educated in and performed IS x2, max value ~1250 mL. Encouraged to perform 10x/hour for pulmonary hygiene. Encouraged to sit OOB as tolerated for same. Educated in and performed AP, LAQ and seated marching B and encouraged to perform LE therex throughout the day to decrease effects of immobility. Instructed not to get up without assistance for safety. Pt verbalized understanding for all education  provided.    Educated the patient to role of physical therapy, plan of care, goals of therapy and HEP, safety with mobility and ADLs.    At end of session pt seated upright in chair, call bell and items in reach. RN aware. Chair alarm on.      Therapist PPE during session gloves      Ladon Applebaum, PT, DPT, CBIS  Ext: [phone]

## 2021-11-04 NOTE — UM Notes (Signed)
[facility] utilization review   Please call Asher Torpey at [phone] (confidential voicemail only) main UR # [phone] or e-mail at [email or web address] with any questions.  Fax final authorization and request for additional information to [phone].    IP admission 11/03/21 s/p fall    1. Fall, initial encounter    2. Closed fracture of multiple ribs of left side, initial encounter    3. Traumatic pneumothorax, initial encounter        Scheduled Meds:  Current Facility-Administered Medications   Medication Dose Route Frequency    acetaminophen  1,000 mg Oral Q8H [place]    amLODIPine  10 mg Oral Daily    atorvastatin  40 mg Oral QHS    cefTRIAXone  1 g Intravenous Q24H    enoxaparin  40 mg Subcutaneous Q12H    gabapentin  100 mg Oral Q8H [place]    ketorolac  15 mg Intravenous Q6H    lidocaine  2 patch Transdermal Q24H    methocarbamol  500 mg Oral Q8H [place]    senna-docusate  1 tablet Oral Q12H [place]     Continuous Infusions:  PRN Meds:.hydrALAZINE, HYDROmorphone, magnesium oxide **OR** magnesium sulfate, ondansetron **OR** ondansetron, oxyCODONE **OR** oxyCODONE, potassium chloride **OR** potassium chloride **OR** potassium chloride, sodium phosphates 15 mmol in dextrose 5 % 250 mL IVPB, sodium phosphates 25 mmol in dextrose 5 % 250 mL IVPB, sodium phosphates 35 mmol in dextrose 5 % 250 mL IVPB    Attending H/P  #Neuro:  Acute Post traumatic Pain  -multimodal pain control     CVA  -residual right sided deficitss and, aphasia and apraxia     NPH  -followed by [facility] neurosurgery and Dr. Dillard Cannon for neurology  -stable dilated ventricles w VP shunt in place  -last revised in 2022  GCS14     #Resp:  Left hydropneumothorax  -repeat XR shows slight increase in size, still w/o resp compromise  -am CXR or sooner for respiratory distress  -supplemental oxygen to maintain oxygen saturation >92%     #Cardio:  HLD  -con't home meds     Atrial Fibrillation  -hold xarelto  -not on rate control medications at  home     Hypertension  -con't home meds w hold parameters    #GI:  Bowel regimen     #Infectious Disease (ID):  UTI  -Urine culture pending  - 5 day course of rocephin           #Hem/Onc: no active issues  -trend daily CBC     #Renal/Fluid, Electrolytes:  Urinary Tract Infection  -UA positive for leukocytes and nitrites  -follow up urine culture  -started on 5 day course of Rocephin (stop date ordered)     #Endo: no active issues  -goal blood sugar 140-180       #Vascular: no active issues     #Musculoskeletal:  Left sided Lateral Rib Fractures, 5-8  -multimodal pain management  -aggressive pulmonary hygiene, q1hr IS  -daily CXR      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

## 2021-11-04 NOTE — Plan of Care (Signed)
STICU DAILY CHECKLIST           Analgesia/sedation:         Pain well controlled:   [x]$  Yes    []$  No  Continuous Sedation: []$  Yes    [x]$  No    Sedation vacation:      []$  Yes    []$  No   [x]$  N/A   Last CPOT:    Last RASS: RASS Score: Drowsy    Restraint(s):  [x]$  N/A    []$  Discontinue    []$  Start/Continue to prevent self-harm   Delirium:  Positive or Negative for Delirium?:Positive or Negative for Delirium: Negative  CAM-ICU:     []$  N/A    []$  Discontinue    [x]$  Start/Continue     Respiratory:              []$  Mechanical Ventilation, Days __  [x]$  N/A   Readiness to wean & extubate (SAT/SBT):    []$  Yes   []$  No  [x]$  N/A   Needs Trach:    []$  Yes    []$  No   [x]$  N/A   HOB 30-45 degrees  [x]$  Yes    []$  No, reason:     Gasterointestinal:    Bowel Regimen:    [x]$  Yes    []$  No  Nutrition at goal?   [x]$  Yes    []$  No   []$  N/A   Ulcer Prophylaxis: [x]$  Not indicated  []$  Discontinue    []$  Start/Continue  Rectal tube:           [x]$  N/A    []$  Discontinue    []$  Insert/Continue, Days   Glucose controlled [x]$  Yes    []$  No     Genitourinary:   Electrolyte protocol ordered:  [x]$  Yes  []$  N/A   Fluids:   []$  Start/Continue  []$  Discontinue   [x]$  N/A     Hospital-Acquired Infections:  Foley:    [x]$  None   []$  Discontinue    []$  Insert/Continue, Days__  Lines/Tubes:  []$  NA  []$  CVC []$  Aline []$  PICC/midline []$  Surgical drain []$  Chest tube []$  Other  []$  Discontinue   []$  Insert/Continue, Days__     Antibiotics:  []$  N/A   [x]$  Stop date ordered    []$  Appropriate for current cultures  Fever protocol ordered:    [x]$ Yes  []$ N/A     MSK:  Cervical spine clearance reviewed: [x]$  Yes  []$  N/A   PT/OT appropriate:  [x]$ Start/Continue  []$ No   []$ N/A   Activity (PT/OT):    []$ Bedrest    [x]$ OOB/Ambulate  Current Mobility Level:   PMP Activity: Step 3 - Bed Mobility (11/04/2021  8:00 AM)    VTE Prevention:     [x]$  SCDs    [x]$  Chemical prophylaxis    []$  Therapeutic AC      []$  On hold:     All labs/images reviewed: [x]$  Yes  []$  N/A   Daily labs/images ordered:  [x]$  Yes  []$   N/A   Home medications reviewed and started if appropriate: [x]$  Yes   []$  No  Family updated:    [x]$  Yes  []$  N/A     Tomi Bamberger, PA

## 2021-11-04 NOTE — Progress Notes (Signed)
12 hours since previous straight cath, no output into ex cath at this point.     BS showed 28m; pt with no urge to void, intermittent straight cath performed and 2275mamber urine returned.

## 2021-11-04 NOTE — Plan of Care (Signed)
Problem: Moderate/High Fall Risk Score >5  Goal: Patient will remain free of falls  Outcome: Progressing  Flowsheets (Taken 11/03/2021 2000)  High (Greater than 13):   HIGH-Visual cue at entrance to patient's room   HIGH-Bed alarm on at all times while patient in bed   HIGH-Utilize chair pad alarm for patient while in the chair   HIGH-Apply yellow "Fall Risk" arm band   HIGH-Initiate use of floor mats as appropriate   HIGH-Consider use of low bed     Problem: Safety  Goal: Patient will be free from injury during hospitalization  Outcome: Progressing  Flowsheets (Taken 11/03/2021 2358)  Patient will be free from injury during hospitalization:   Provide and maintain safe environment   Assess patient's risk for falls and implement fall prevention plan of care per policy   Use appropriate transfer methods   Ensure appropriate safety devices are available at the bedside   Include patient/ family/ care giver in decisions related to safety   Assess for patients risk for elopement and implement [place] per policy   Hourly rounding   Provide alternative method of communication if needed (communication boards, writing)  Goal: Patient will be free from infection during hospitalization  Outcome: Progressing  Flowsheets (Taken 11/03/2021 2358)  Free from Infection during hospitalization:   Assess and monitor for signs and symptoms of infection   Monitor lab/diagnostic results   Monitor all insertion sites (i.e. indwelling lines, tubes, urinary catheters, and drains)   Encourage patient and family to use good hand hygiene technique     Problem: Pain  Goal: Pain at adequate level as identified by patient  Outcome: Progressing  Flowsheets (Taken 11/03/2021 2358)  Pain at adequate level as identified by patient:   Identify patient comfort function goal   Assess for risk of opioid induced respiratory depression, including snoring/sleep apnea. Alert healthcare team of risk factors identified.   Assess pain on admission,  during daily assessment and/or before any "as needed" intervention(s)   Reassess pain within 30-60 minutes of any procedure/intervention, per Pain Assessment, Intervention, Reassessment (AIR) Cycle   Evaluate if patient comfort function goal is met   Evaluate patient's satisfaction with pain management progress   Offer non-pharmacological pain management interventions   Include patient/patient care companion in decisions related to pain management as needed     Problem: Side Effects from Pain Analgesia  Goal: Patient will experience minimal side effects of analgesic therapy  Outcome: Progressing  Flowsheets (Taken 11/03/2021 2358)  Patient will experience minimal side effects of analgesic therapy:   Monitor/assess patient's respiratory status (RR depth, effort, breath sounds)   Assess for changes in cognitive function   Prevent/manage side effects per LIP orders (i.e. nausea, vomiting, pruritus, constipation, urinary retention, etc.)   Evaluate for opioid-induced sedation with appropriate assessment tool (i.e. POSS)     Problem: Compromised Hemodynamic Status  Goal: Vital signs and fluid balance maintained/improved  Outcome: Progressing  Flowsheets (Taken 11/03/2021 2358)  Vital signs and fluid balance are maintained/improved:   Position patient for maximum circulation/cardiac output   Monitor/assess vitals and hemodynamic parameters with position changes   Monitor and compare daily weight   Monitor intake and output. Notify LIP if urine output is less than 30 mL/hour.   Monitor/assess lab values and report abnormal values     Problem: Inadequate Gas Exchange  Goal: Adequate oxygenation and improved ventilation  Outcome: Progressing  Flowsheets (Taken 11/03/2021 2358)  Adequate oxygenation and improved ventilation:   Assess lung  sounds   Monitor SpO2 and treat as needed   Monitor and treat ETCO2   Provide mechanical and oxygen support to facilitate gas exchange   Position for maximum ventilatory efficiency    Teach/reinforce use of incentive spirometer 10 times per hour while awake, cough and deep breath as needed   Plan activities to conserve energy: plan rest periods   Increase activity as tolerated/progressive mobility   Consult/collaborate with Respiratory Therapy  Goal: Patent Airway maintained  Outcome: Progressing  Flowsheets (Taken 11/03/2021 2358)  Patent airway maintained:   Position patient for maximum ventilatory efficiency   Provide adequate fluid intake to liquefy secretions   Reinforce use of ordered respiratory interventions (i.e. CPAP, BiPAP, Incentive Spirometer, Acapella, etc.)   Suction secretions as needed   Reposition patient every 2 hours and as needed unless able to self-reposition     Problem: Inadequate Airway Clearance  Goal: Normal respiratory rate/effort achieved/maintained  Outcome: Progressing  Flowsheets (Taken 11/03/2021 2358)  Normal respiratory rate/effort achieved/maintained: Plan activities to conserve energy: plan rest periods

## 2021-11-04 NOTE — OT Eval Note (Signed)
Occupational Therapy Evaluation  Patient: [name]    ST04/ST04.A  Discharge Recommendations:   Based on today's session: ALF;Home with home health OT     If pt returns home, DME Recommended for Discharge: [facility]    Unit: [facility]  Bed: ST04/ST04.A      Assessment:   [name] is a 76 y.o. male admitted 11/03/2021.   Expanded chart review completed including review of labs, review of imaging, review of past hospitalizations, review of H&P and physician progress notes, and review of consulting physician notes .  Pt's ability to complete ADLs and functional transfers is impaired due to the following deficits:  decreased activity tolerance, decreased balance, decreased bed mobility, and decreased strength.  Pt demonstrates performance deficits with grooming, bathing, dressing, toileting, and functional mobility. There are a few comorbidities or other factors that affect plan of care and require modification of task including: assistive device needed for mobility and PMH.  Pt would continue to benefit from OT to address these deficits and increase functional independence.    Assessment: decreased ROM;decreased strength;balance deficits;decreased independence with ADLs;decreased independence with IADLs;decreased endurance/activity tolerance     Complexity Chart Review Performance Deficits Clinical Decision Making Hx/Comorbidities Assistance needed   Low Brief 1-3 Limited options None None (or at baseline)   Moderate Expanded 3-5 Several Options 1-2 Min/Mod assist (not at baseline)   High Extensive 5 or more Multiple options 3 or more Max/dependent (not at baseline     Therapy Diagnosis: generalized weakness, decreased functional mobility , decreased independence with ADL's, and decreased endurance/ activity engagement. Without therapy interventions, patient is at risk for falls, dependence on caregivers for mobility, dependence on caregivers for ADL's, decreased  independence, and failure to return to PLOF.    Rehabilitation Potential: Prognosis: Good;With continued OT s/p acute discharge      Plan:   OT Frequency Recommended: 2-3x/wk   Treatment Interventions: ADL retraining;Functional transfer training;UE strengthening/ROM;Neuro muscular reeducation          Risks/Benefits/POC Discussed with Pt/Family: With patient    Goals:   Goal Formulation: Patient  Time For Goal Achievement: by time of discharge  ADL Goals  Patient will groom self: Stand by Assist;at edge of bed;5 visits  Patient will dress upper body: Minimal Assist;5 visits  Mobility and Transfer Goals  Pt will transfer bed to [facility]: Contact Guard Assist;with rolling walker;5 visits                             ST04/ST04.A    OT Received On: 11/04/21  Start Time: 0919  Stop Time: 0937  Time Calculation (min): 18 min  OT Visit Number: 1    Consult received for [name] for OT Evaluation and Treatment.  Patient's medical condition is appropriate for Occupational therapy intervention at this time.    Precautions and Contraindications:   Precautions  Other Precautions: Falls, L rib fractures, hx of CVA with R sided weakness      Medical Diagnosis: Traumatic pneumothorax, initial encounter [S27.0XXA]  Fall, initial encounter [id number]]  Closed fracture of multiple ribs of left side, initial encounter [S22.42XA]    History of Present Illness: [name] is a 76 y.o. male admitted on 11/03/2021 "with PMH of  CVA w/ aphasia and R hemiplegia, apraxia, afib,  NPH s/p VP shunt followed with Dr. Dillard Cannon neurology, hld, htn, admitted on 11/03/21 for fall dx with L rib fractures, UTI on  abx, and L hydropneumothorax on CXR" - Per M    Patient Active Problem List   Diagnosis    NPH (normal pressure hydrocephalus)    S/P VP shunt    HLD (hyperlipidemia)    Fall, initial encounter    Closed fracture of multiple ribs of left side with routine healing    Primary hypertension    Other hyperlipidemia    Atrial fibrillation    CVA  (cerebral vascular accident)    Aphasia    Apraxia        Past Medical/Surgical History:  Past Medical History:   Diagnosis Date    Aphasia     Apraxia     Atrial fibrillation     Chronic pain     CVA (cerebral vascular accident)     Hemiplegia and hemiparesis following cerebral infarction affecting right dominant side     HLD (hyperlipidemia)     Major depression       Past Surgical History:   Procedure Laterality Date    VENTRICULOPERITONEAL SHUNT  2018    shunt revision 2022         X-Rays/Tests/Labs:  XR Chest AP Portable    Result Date: 11/04/2021  No acute cardiopulmonary disease. Ardelia Mems, MD 11/04/2021 7:47 AM    XR Chest AP Portable    Result Date: 11/03/2021  Left-sided rib fractures, not significantly displaced. No new pneumothorax. No visible effusion by x-ray. Elpidio Eric, MD 11/03/2021 5:43 PM    CT L- Spine without Contrast    Result Date: 11/03/2021   1.Chronic appearing compression fractures at L1 and L2. 2. No acute fractures are seen. 3. Degenerative facet changes are noted with canal stenosis at L3-L4 and L4-L5. Oren Binet, MD 11/03/2021 7:50 AM    CT Chest without Contrast    Result Date: 11/03/2021  1.Mildly displaced left 5-8 rib fractures with trace left hydropneumothorax and dependent atelectasis. 2.No acute fracture or traumatic subluxation of the thoracic spine. 3.Additional chronic and incidental findings are detailed above. Blanchard Mane 11/03/2021 7:49 AM    CT Reconstruction T-spine    Result Date: 11/03/2021  1.Mildly displaced left 5-8 rib fractures with trace left hydropneumothorax and dependent atelectasis. 2.No acute fracture or traumatic subluxation of the thoracic spine. 3.Additional chronic and incidental findings are detailed above. 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 2. There is no herniation or intracranial hemorrhage. 3.There is no definite acute intracranial abnormality. 4. Old  left frontal infarct. Oren Binet, MD 11/03/2021 7:39 AM    CT Cervical Spine without Contrast    Result Date: 11/03/2021   1.No acute fracture or malalignment. 2.Tiny left apical pneumothorax. Please refer to the separately dictated CT of the chest. 3.Right mastoid effusion. Blanchard Mane 11/03/2021 7:38 AM    XR Chest  AP Portable    Result Date: 11/03/2021   1.No definite acute traumatic abnormality. 2. The lungs are clear. There is no detectable pneumothorax. Oren Binet, MD 11/03/2021 7:10 AM      -Per radiology reports     Social History:  Prior Level of Function  Prior level of function: Ambulates with assistive device, Up to chair with assistance, Needs assistance with ADLs  Assistive Device: Front wheel walker, Wheelchair (Per patient report, he was able to ambulate 200 feet with assistance from PT and RW prior to hospitalization. Patient typically uses wheelchair for functional  mobility.)  DME Currently at Home: Gilford Rile, [employer], Radio broadcast assistant, Manual  Home Living Arrangements  Living Arrangements: Alone Engineer, civil (consulting))  Type of Home: Assisted living [facility])  Home Layout: One level, Performs ADL's on one level, Marketing executive Shower/Tub: Tourist information centre manager: Raised  Bathroom Equipment: Grab bars in shower, Built-in shower seat, Hand-held shower  DME Currently at Home: Environmental consultant, [employer], Wheelchair, Manual    Subjective:   Patient is agreeable to participation in the therapy session. Nursing clears patient for therapy.     Pain Assessment  Pain Assessment: No/denies pain (Patient noted to grimmace during bed mobility, when asked about pain once seated in chair patient denied pain.).        Objective:   Observation of Patient/Vital Signs:  Patient is in bed with telemetry, peripheral IV, O2 via nasal cannula, and condom catheter in place.         Cognition/Neuro Status  Arousal/Alertness: Delayed responses to stimuli  Attention Span: Appears intact  Orientation Level: Oriented X4  Insights: Educated in  Surveyor, mining  Behavior: calm;cooperative  Motor Planning: decreased processing speed;decreased initiation  Coordination: Rake impaired;GMC impaired  Hand Dominance: right handed    Gross ROM  Right Upper Extremity ROM: needs focused assessment  RUE ROM - Shoulder: reduced by 50%  RUE ROM - Elbow: reduced by 25%  RUE ROM - Hand: within functional limits  Left Upper Extremity ROM: needs focused assessment  LUE ROM - Shoulder - % Reduced: reduced by 25%  LUE ROM - Elbow - % Reduced: within functional limits  LUE ROM - Hand - % Reduced: within functional limits (Except 3rd digit unable to flex at IP joint (chronic per patient report))  Gross Strength  Right Upper Extremity Strength: 3-/5 (per functional observation)  Left Upper Extremity Strength:  (At least 3+/5 per functional observation)                  Self-care and Home Management  Eating: Supervision;in bed;Beverage management  UB Dressing: Moderate Assist;sitting;at edge of bed;thread RUE;thread LUE (Don/doff gown)  LB Dressing: Dependent;in bed;Don/doff R sock;Don/doff L sock    Mobility and Transfers  Supine to Sit: Moderate Assist; HOB raised; increased time; use of bed rail; physical assist for trunk and lower extremity management  Sit to Stand: Minimal Assist with RW from EOB  Stand to Sit: Minimal Assist with RW to chair; physical assist for controlled lowering onto chair  Bed to Chair: Minimal Assist with RW     Balance  Static Sitting Balance: good  Dyanamic Sitting Balance: good  Static Standing Balance: fair (with RW)  Dynamic Standing Balance: fair (with RW)    Participation and Endurance  Participation Effort: good    AM-PACT "6 Clicks" Daily Activity Inpatient Short Form  Inpatient AM-PACT Performed?: yes  Put On/Take Off Lower Body Clothing: Total  Assist with Bathing: Total  Assist with Toileting: Total  Put On/Take Off Upper Body Clothing: A lot  Assist with Grooming: A little  Assist with Eating: None  OT Daily Activity Raw Score: 12  CMS  0-100% Score: 66.57%    PMP - Progressive Mobility Protocol   PMP Activity: Step 5 - Chair    Treatment Activities:     Educated the patient to role of occupational therapy, plan of care, goals of therapy and HEP, safety with mobility and ADLs. Educated patient on importance of sitting OOB to increase overall activity tolerance and to promote pulmonary hygiene. Advised patient to  call nursing with any needs and to not get up without assistance. Patient remained in room in chair with call button within reach and chair alarm on. RN present/aware of session outcome.    Therapist PPE during session procedural mask and gloves    Rudi Coco, OTR/L  [facility]  Physical Medicine and Rehab Department

## 2021-11-04 NOTE — Discharge Instr - AVS First Page (Addendum)
Reason for your Hospital Admission:  Rib fractures with small left-sided pneumothorax and hemothorax      Instructions for after your discharge:  Left traumatic rib fractures:  5-8  rib on the left  -initial CXR shows Positive findings of: Pneumothorax: left and left very small hemothorax.  -supplemental oxygen as needed  -encourage IS Q 1 hour for pulmonary toilet  -multimodal pain control to include tylenol, robaxin, lidocaine patch, oxycodone prn  -repeat cxr in am or sooner for hypoxia  -PT/OT consult            Home Health Discharge Information      Your doctor has ordered Physical Therapy and Occupational Therapy in-home service(s) for you while you recuperate at home, to assist you in the transition from hospital to home.   The agency that you or your representative chose to provide the service:  [facility]    Additional comments:   If you have not heard from your home health agency within 24-48 hours after discharge please call your agency to arrange a time for your first visit.  For any scheduling concerns or questions related to home health, such as time or date please contact your home health agency at the number listed above.

## 2021-11-04 NOTE — Progress Notes (Signed)
NURSE NOTE SUMMARY  [facility]   Patient Name: [name],[name]   Attending Physician: Orville Govern, MD   Today's date:   11/04/2021 LOS: 1 days   Shift Summary:                                                              Assumed care of pt 07:00. Pt AxO4 GCS 15, pain well controlled throughout shift not requiring any PRNs. Worked with PT/OT, 2x assist with walker to chair in the AM and tolerated chair until 17:30.     1L NC, hourly IS effort between 1000-1500. SB/NSR on monitor, 50-60s, SBP 100-130s. Tolerating reg diet, no BM this shift, pericolace BID ordered. No fevers, continuing to receive 1G rocephin daily.    Lovenox 40BID & SCDs on for DVT proph. Pt required straight cath 03:00 d/t inability to void for 12 hours; BS at 15:00 revealed 213m urine with pt still not voiding at this time. Intermittent straight cath done, 2274mreturned, MD notified and flomax ordered.    No other acute events. Daughter called by this RN and updated on all events and plan of care.     Provider Notifications:   N/a     Rapid Response Notifications:  Mobility:   N/a   PMP Activity: Step 5 - Chair (11/04/2021  9:20 AM)     Weight tracking:  Family Dynamic:   Last 3 Weights for the past 72 hrs (Last 3 readings):   Weight   11/04/21 0342 84.8 kg (186 lb 15.2 oz)   11/03/21 1226 83.7 kg (184 lb 8.4 oz)   11/03/21 0628 85 kg (187 lb 6.3 oz)             Last Bowel Movement   Last BM Date:  (PTA)

## 2021-11-04 NOTE — Plan of Care (Signed)
Problem: Moderate/High Fall Risk Score >5  Goal: Patient will remain free of falls  Outcome: Progressing     Problem: Safety  Goal: Patient will be free from injury during hospitalization  Outcome: Progressing  Goal: Patient will be free from infection during hospitalization  Outcome: Progressing     Problem: Pain  Goal: Pain at adequate level as identified by patient  Outcome: Progressing     Problem: Side Effects from Pain Analgesia  Goal: Patient will experience minimal side effects of analgesic therapy  Outcome: Progressing     Problem: Discharge Barriers  Goal: Patient will be discharged home or other facility with appropriate resources  Outcome: Progressing     Problem: Psychosocial and Spiritual Needs  Goal: Demonstrates ability to cope with hospitalization/illness  Outcome: Progressing     Problem: Compromised Hemodynamic Status  Goal: Vital signs and fluid balance maintained/improved  Outcome: Progressing     Problem: Inadequate Gas Exchange  Goal: Adequate oxygenation and improved ventilation  Outcome: Progressing  Goal: Patent Airway maintained  Outcome: Progressing     Problem: Inadequate Airway Clearance  Goal: Normal respiratory rate/effort achieved/maintained  Outcome: Progressing     Problem: Inadequate Tissue Perfusion-Venous  Goal: Tissue perfusion is adequate-venous  Outcome: Progressing     Problem: Impaired Mobility  Goal: Mobility/Activity is maintained at optimal level for patient  Outcome: Progressing     Problem: Nutrition  Goal: Nutritional intake is adequate  Outcome: Progressing     Problem: Compromised Tissue integrity  Goal: Damaged tissue is healing and protected  Outcome: Progressing  Goal: Nutritional status is improving  Outcome: Progressing

## 2021-11-04 NOTE — Student Progress (Signed)
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 Dillard Cannon     #Pulmonary   Left Rib Fxs Ribs 5-8 w/ small Hemothorax   -Currently on 2L NC plan to wean pt. Off O2  -CXR ordered and reviewed showing a small left sided Hemothorax, unchanged from previous XR  -Encourage IS   -Multimodal pain control    #Cardiac   HLD   -continue at home Atorvastatin   HTN   -Cont at home Amlodipine  Afib   -hold Xarelto    #GI  -No active issues    #Infectious Disease   UTI     -Started on CTX   -Urine culture pending... will adjust therapy as needed based on results    #Hematology/Oncology  Acute blood loss anemia    -Likely attributed to above injury   - Will Trend CBC    Thrombocytopenia    -Likely Consumptive in response to acute blood loss   - Will trend platelets    #Nephrology  Cr, BUN are WNL  #Endo  No active issues    #Vascular   No active issues    #MSK   No active issues    #Integumentary   No active issue    #Nutrition   On regular diet    Dispo: Recommended for SNF cases management contacted for rehab planning     SUBJECTIVE:   [name] is a 76 y/o male who has a PMH of Aphasia, Apraxia, Atrial fibrillation, Chronic pain, CVA (cerebral vascular accident), Hemiplegia and hemiparesis following cerebral infarction affecting right dominant side, HLD (hyperlipidemia), and Major depression. He suffered a GLF in the bathroom that resulted in back and left sided chest wall pain. Today he denies any back pain but endorsees very minimal left sided chest wall pain. Patient feels much improved on multimodal pain regimen but is currently still on 2L NC. Patient denies any chest pain, SOB, lightheadedness,  weakness, worsening pain, numbness and tingling   MEDICATIONS:     Current Facility-Administered Medications   Medication Dose Route Frequency    acetaminophen  1,000 mg Oral Q8H [place]    amLODIPine  10 mg Oral Daily    atorvastatin  40 mg Oral QHS    cefTRIAXone  1 g Intravenous Q24H    enoxaparin  40 mg Subcutaneous Q12H    gabapentin  100 mg Oral Q8H [place]    ketorolac  15 mg Intravenous Q6H    lactobacillus/streptococcus  1 capsule Oral Daily    lidocaine  2 patch Transdermal Q24H    methocarbamol  500 mg Oral Q8H [place]    senna-docusate  1 tablet Oral Q12H [employer] [place]     PHYSICAL EXAM:     Vitals:    11/04/21 0845 11/04/21 0900 11/04/21 1000 11/04/21 1100   BP: 124/57 124/57 112/54 113/64   Pulse:  66 63 75   Resp:  15 16 17   $ Temp:       TempSrc:       SpO2:  97% 97% 98%   Weight:       Height:  Physical Exam    General appearance - alert, well appearing, and in no distress and oriented to person, place, and time  Mental status - alert, oriented to person, place, and time, normal mood, behavior, speech, dress, motor activity, and thought processes  Mouth - mucous membranes moist, pharynx normal without lesions  Chest - clear to auscultation, no wheezes, rales or rhonchi, symmetric air entry, no tachypnea, retractions or cyanosis, chest wall tenderness noted to the  left chest wall only but minimal discomfort   Heart - normal rate, regular rhythm, normal S1, S2, no murmurs, rubs, clicks or gallops, normal rate and regular rhythm, S1 and S2 normal  Abdomen - soft, nontender, nondistended, no masses or organomegaly  no hernias noted  Neurological - alert, oriented, normal speech, no focal findings or movement disorder noted  Musculoskeletal - no joint tenderness, deformity or swelling, no muscular tenderness noted  Extremities - peripheral pulses normal, no pedal edema, no clubbing or cyanosis, no pedal edema noted  Skin - normal coloration and turgor, no rashes, no suspicious skin lesions noted    Patient  Lines/Drains/Airways Status       Active PICC Line / CVC Line / PIV Line / Drain / Airway / Intraosseous Line / Epidural Line / ART Line / Line / Wound / Pressure Ulcer / NG/OG Tube       Name Placement date Placement time Site Days    Peripheral IV 11/03/21 18 G Standard Anterior;Right Wrist 11/03/21  0645  Wrist  1    Peripheral IV 11/03/21 22 G Diffusion Left Antecubital 11/03/21  0840  Antecubital  1    External Urinary Catheter 11/03/21  --  --  1                  Intake and Output Summary (Last 24 hours) at Date Time    Intake/Output Summary (Last 24 hours) at 11/04/2021 1148  Last data filed at 11/04/2021 0600  Gross per 24 hour   Intake 590 ml   Output 370 ml   Net 220 ml       Screening / Interventions  Lab Results   Component Value Date/Time    ALCOHOL NONE DETECTED 11/03/2021 06:54 AM      Lab Results   Component Value Date/Time    AMPHETAMINUR Negative 11/03/2021 09:45 AM    BARBITURATEU Negative 11/03/2021 09:45 AM    BENZODIAZEUR Negative 11/03/2021 09:45 AM    CANNABINOIDU Negative 11/03/2021 09:45 AM    COCAINEUR Negative 11/03/2021 09:45 AM    URFENTANYL Positive (A) 11/03/2021 09:45 AM    OPIATEUR Positive (A) 11/03/2021 09:45 AM    PCPUR Negative 11/03/2021 09:45 AM      SBIRT Score:   Trauma Performed SBIRT: Yes     CATS Requested: No,    Additional Screening Methods: None    LABS:     Results       Procedure Component Value Units Date/Time    Basic Metabolic Panel [id number]  (Abnormal) Collected: 11/04/21 0345    Specimen: Blood Updated: 11/04/21 0435     Glucose 111 mg/dL      BUN 25.0 mg/dL      Creatinine 1.0 mg/dL      Calcium 8.5 mg/dL      Sodium 139 mEq/L      Potassium 4.1 mEq/L      Chloride 107 mEq/L      CO2 22 mEq/L      Anion Gap 10.0  eGFR >60.0 mL/min/1.73 m2     Narrative:      Trauma    Magnesium [id number] Collected: 11/04/21 0345    Specimen: Blood Updated: 11/04/21 0435     Magnesium 2.1 mg/dL     Narrative:      Trauma    Phosphorus [id number]  (Abnormal) Collected:  11/04/21 0345    Specimen: Blood Updated: 11/04/21 0435     Phosphorus 5.0 mg/dL     Narrative:      Trauma    CBC without differential [id number]  (Abnormal) Collected: 11/04/21 0345    Specimen: Blood Updated: 11/04/21 0414     WBC 7.63 x10 3/uL      Hgb 11.5 g/dL      Hematocrit 34.8 %      Platelets 135 x10 3/uL      RBC 3.79 x10 6/uL      MCV 91.8 fL      MCH 30.3 pg      MCHC 33.0 g/dL      RDW 13 %      MPV 9.9 fL      Nucleated RBC 0.0 /100 WBC      Absolute NRBC 0.00 x10 3/uL     Narrative:      Trauma    Type and Screen [id number] Collected: 11/03/21 1312     Updated: 11/03/21 1406     ABO Rh B POS     AB Screen Gel NEG    ABO/Rh [id number] Collected: 11/03/21 1239    Specimen: Blood Updated: 11/03/21 1335     ABO Rh B POS          RADS:   Radiological procedure personally reviewed:     XR Chest AP Portable    Result Date: 11/04/2021  No acute cardiopulmonary disease. Ardelia Mems, MD 11/04/2021 7:47 AM    XR Chest AP Portable    Result Date: 11/03/2021  Left-sided rib fractures, not significantly displaced. No new pneumothorax. No visible effusion by x-ray. Elpidio Eric, MD 11/03/2021 5:43 PM   EKG Results       Procedure Component Value Units Date/Time    ECG 12 lead [id number] Collected: 11/03/21 0659     Updated: 11/03/21 1305     Ventricular Rate 59 BPM      Atrial Rate 59 BPM      P-R Interval 166 ms      QRS Duration 106 ms      Q-T Interval 460 ms      QTC Calculation (Bezet) 455 ms      P Axis 16 degrees      R Axis -19 degrees      T Axis -2 degrees      IHS MUSE NARRATIVE AND IMPRESSION --     SINUS BRADYCARDIA  MODERATE VOLTAGE CRITERIA FOR LVH, MAY BE NORMAL VARIANT ( R in aVL , [employer] product )  NONSPECIFIC T WAVE ABNORMALITY  BORDERLINE NORMAL/ABNORMAL ELECTROCARDIOGRAM  WHEN COMPARED WITH ECG OF 19-Oct-2021 07:29,  NO SIGNIFICANT CHANGE WAS FOUND  Confirmed by PARK MD, YOUNG ([id number]) on 11/03/2021 1:05:04 PM      Narrative:      SINUS BRADYCARDIA  MODERATE VOLTAGE CRITERIA FOR LVH, MAY BE  NORMAL VARIANT ( R in aVL , [employer] product )  NONSPECIFIC T WAVE ABNORMALITY  BORDERLINE NORMAL/ABNORMAL ELECTROCARDIOGRAM  WHEN COMPARED WITH ECG OF 19-Oct-2021 07:29,  NO SIGNIFICANT CHANGE WAS FOUND  Confirmed by PARK MD, YOUNG ([id number]) on 11/03/2021 1:05:04 PM  SIGNED BY:     Zavien Clubb PA-S2  I have seen and examined this patient with PA-S Glendale Youngblood and agree with their findings and plans. I have personally reviewed the patient's history and 24 hour interval events, along with vitals, labs, radiology images and nursing notes, as well as the medical record from the previous hospitalizations, and outside records available. So far today I have spent 50 minutes providing care for this patient excluding teaching and billable procedures, and not overlapping with any other providers with greater than 50% of the time in counseling or coordination of care.    Thad Ranger, PA-C  [facility]  [phone]   *This note was generated by the Epic EMR system/ Dragon speech recognition and may contain inherent errors or omissions not intended by the user. Grammatical errors, random word insertions, deletions, pronoun errors and incomplete sentences are occasional consequences of this technology due to software limitations. Not all errors are caught or corrected. If there are questions or concerns about the content of this note or information contained within the body of this dictation they should be addressed directly with the author for clarification    *Due to pandemic of coronavirus and based on hospital policies and procedures ; this document may contain, but not limited to, information based on direct patient contact, observation , telephone conversation, use of A/V devices , medical records, conversation with other treatment team members, information and physical exam by other treatment team members.

## 2021-11-04 NOTE — Consults (Signed)
PHYSICAL MEDICINE AND REHABILITATION   INITIAL CONSULTATION NOTE    PATIENT NAME: [name] is a 76 y.o. male    REQUESTING PHYSICIAN: Dr. Tyron Russell, Romilda Joy, PA       CONSULTING SERVICE:  Physical Medicine and Rehabilitation  REASON FOR CONSULTATION:  Debility and placement.             HISTORY OF PRESENT ILLNESS:  This is a 76 yo M with PMH of  CVA w/ aphasia and R hemiplegia, apraxia, afib,  NPH s/p VP shunt followed with Dr. Dillard Cannon neurology, hld, htn, admitted on 11/03/21 for fall dx with L rib fractures, UTI on abx, and L hydropneumothorax on CXR.     Patient subsequently has significant musculoskeletal and cardiovascular deconditioning, needing placement recommendations.     Patient c/o; L rib pain with minimal movement.     WEIGHT BEARING RESTRICTIONS:  NONE      PREMORBID LEVEL OF FUNCTION:    Prior to the onset of these symptoms, the patient was independent with ambulation, transfers, ADL's, cognition, speech, and swallowing.    Patient lives in [place].     CURRENT LEVEL OF FUNCTION:  Therapy notes reviewed by me.  Latest PT note: not seen yet.     PAST MEDICAL HISTORY:    Past Medical History:   Diagnosis Date    Aphasia     Apraxia     Atrial fibrillation     Chronic pain     CVA (cerebral vascular accident)     Hemiplegia and hemiparesis following cerebral infarction affecting right dominant side     HLD (hyperlipidemia)     Major depression        FAMILY HISTORY:    Family History   Problem Relation Age of Onset    Cancer Mother     Cancer Father     Hypertension Brother         SOCIAL HISTORY:  Denies use of tobacco, EtOH, or illicit drugs.      ALLERGIES:      Allergies   Allergen Reactions    Ace Inhibitors      Other reaction(s): Cough       MEDICATIONS:      Current Facility-Administered Medications:    Scheduled Meds:  Current Facility-Administered Medications   Medication Dose Route Frequency    acetaminophen  1,000 mg Oral Q8H [place]    amLODIPine  10 mg Oral Daily    atorvastatin   40 mg Oral QHS    cefTRIAXone  1 g Intravenous Q24H    enoxaparin  40 mg Subcutaneous Q12H    gabapentin  100 mg Oral Q8H [place]    ketorolac  15 mg Intravenous Q6H    lidocaine  2 patch Transdermal Q24H    methocarbamol  500 mg Oral Q8H [place]    senna-docusate  1 tablet Oral Q12H [facility]     PRN Meds:.hydrALAZINE, HYDROmorphone, magnesium oxide **OR** magnesium sulfate, ondansetron **OR** ondansetron, oxyCODONE **OR** oxyCODONE, potassium chloride **OR** potassium chloride **OR** potassium chloride, sodium phosphates 15 mmol in dextrose 5 % 250 mL IVPB, sodium phosphates 25 mmol in dextrose 5 % 250 mL IVPB, sodium phosphates 35 mmol in dextrose 5 % 250 mL IVPB    REVIEW OF SYSTEMS:  Constitutional: no fevers/chills; no recent weight change  HEENT: no headaches, no visual changes, no hearing loss, no change in vocal quality  Pulm: no cough, no SOB  CV: no chest pain, no orthopnea,  or palpitations  GI:  good appetite, no abdominal pain,no nausea/vomiting  GU: no dysuria, no hematuria  Neuro: no syncope, or numbness/tingling  Psych: no depression, no anxiety  Skin:  no rashes, no open wounds  All others negative    PHYSICAL EXAM:      BP 123/57   Pulse (!) 57   Temp 97.1 F (36.2 C)   Resp 12   Ht 1.753 m (5' 9"$ )   Wt 84.8 kg (186 lb 15.2 oz)   SpO2 97%   BMI 27.61 kg/m   Estimated body mass index is 27.61 kg/m as calculated from the following:    Height as of this encounter: 1.753 m (5' 9"$ ).    Weight as of this encounter: 84.8 kg (186 lb 15.2 oz).    General:  Not in acute distress; well groomed, well nourished and well developed.   Mental Status: See below.   Head: Normocephalic; atraumatic  Eyes:  Extra-ocular muscles intact; Sclera not injected  Neck: Trachea midline  Chest and Lung Exam: Normal excursion with symmetric chest walls and quiet, even and easy respiratory effort with no use of accessory muscles  Cardiovascular: No digital clubbing, cyanosis, edema, increased warmth or tenderness.  Abd: Normal BS,  soft, nontender, nondistended  Skin: No erythema; No warmth; No open wounds.   Neurological Exam:    MS/Cog/ Speech:   Alert , has expressive aphasia,  cooperative and maintains good eye contact; answers questions appropriately.  slurred Speech  Able to follow all motor and verbal commands.    CN II-XII grossly intact bilaterally.     MMT  Muscle group Right Left   Shoulder abductors 3 5   Elbow flexors 4 5             Hip flexors 4 5                              Spasticity: no evidence    Finger to nose test- ataxic        Psych: Alert Pleasant and cooperative    LABS:  Reviewed  Results       Procedure Component Value Units Date/Time    Basic Metabolic Panel [id number]  (Abnormal) Collected: 11/04/21 0345    Specimen: Blood Updated: 11/04/21 0435     Glucose 111 mg/dL      BUN 25.0 mg/dL      Creatinine 1.0 mg/dL      Calcium 8.5 mg/dL      Sodium 139 mEq/L      Potassium 4.1 mEq/L      Chloride 107 mEq/L      CO2 22 mEq/L      Anion Gap 10.0     eGFR >60.0 mL/min/1.73 m2     Narrative:      Trauma    Magnesium [id number] Collected: 11/04/21 0345    Specimen: Blood Updated: 11/04/21 0435     Magnesium 2.1 mg/dL     Narrative:      Trauma    Phosphorus [id number]  (Abnormal) Collected: 11/04/21 0345    Specimen: Blood Updated: 11/04/21 0435     Phosphorus 5.0 mg/dL     Narrative:      Trauma    CBC without differential [id number]  (Abnormal) Collected: 11/04/21 0345    Specimen: Blood Updated: 11/04/21 0414     WBC 7.63 x10 3/uL      Hgb 11.5 g/dL  Hematocrit 34.8 %      Platelets 135 x10 3/uL      RBC 3.79 x10 6/uL      MCV 91.8 fL      MCH 30.3 pg      MCHC 33.0 g/dL      RDW 13 %      MPV 9.9 fL      Nucleated RBC 0.0 /100 WBC      Absolute NRBC 0.00 x10 3/uL     Narrative:      Trauma    Type and Screen [id number] Collected: 11/03/21 1312     Updated: 11/03/21 1406     ABO Rh B POS     AB Screen Gel NEG    ABO/Rh [id number] Collected: 11/03/21 1239    Specimen: Blood Updated: 11/03/21 1335     ABO Rh  B POS    Rapid drug screen, urine [id number]  (Abnormal) Collected: 11/03/21 0945    Specimen: Urine Updated: 11/03/21 1004     Urine Amphetamine Screen Negative     Barbiturate Screen, UR Negative     Benzodiazepine Screen, UR Negative     Cannabinoid Screen, UR Negative     Cocaine, UR Negative     Urine Fentanyl Positive     Opiate Screen, UR Positive     PCP Screen, UR Negative    Urinalysis Reflex to Microscopic Exam- Reflex to Culture [id number]  (Abnormal) Collected: 11/03/21 0945    Specimen: Urine, Clean Catch Updated: 11/03/21 0954     Urine Type Urine, Clean Ca     Color, UA Yellow     Clarity, UA Hazy     Specific Gravity UA 1.031     Urine pH 6.5     Leukocyte Esterase, UA Moderate     Nitrite, UA Positive     Protein, UR 30= 1+     Glucose, UA Negative     Ketones UA Trace     Urobilinogen, UA Normal mg/dL      Bilirubin, UA Negative     Blood, UA Negative     RBC, UA 0-2 /hpf      WBC, UA TNTC /hpf     Ethanol (Alcohol) Level [id number] Collected: 11/03/21 0654    Specimen: Blood Updated: 11/03/21 0905     Alcohol NONE DETECTED mg/dL             No results for input(s): "GLUCOSEWHOLE" in the last 24 hours.    Recent Labs   Lab 11/04/21  0345 11/03/21  0654   WBC 7.63 7.90   Hgb 11.5* 13.2   Hematocrit 34.8* 39.1   Platelets 135* 156        Recent Labs   Lab 11/04/21  0345 11/03/21  0654   Sodium 139 139   Potassium 4.1 4.5   Chloride 107 107   CO2 22 22   BUN 25.0 13.0   Creatinine 1.0 0.9   Calcium 8.5 8.9   Albumin  --  3.8   Protein, Total  --  7.9   Bilirubin, Total  --  0.5   Alkaline Phosphatase  --  71   ALT  --  19   AST (SGOT)  --  23   Glucose 111* 95   Magnesium 2.1 2.0   Phosphorus 5.0*  --        Recent Labs   Lab 11/03/21  0654   PT INR 1.2*       I&O:  Intake and Output Summary (Last 24 hours) at Date Time Reviewed    Intake/Output Summary (Last 24 hours) at 11/04/2021 0840  Last data filed at 11/04/2021 0600  Gross per 24 hour   Intake 590 ml   Output 370 ml   Net 220 ml     P.O.: 200  mL (11/04/21 0600)        Bladder Scan Volume (mL): 315 mL (11/04/21 0300)  Intermittent/Straight Cath (mL): 350 mL (11/04/21 0300)       IMAGING:  Reviewed  Radiological Procedure reviewed.  Radiology Results (24 Hour)       Procedure Component Value Units Date/Time    XR Chest AP Portable [id number] Collected: 11/04/21 0743    Order Status: Completed Updated: 11/04/21 0749    Narrative:      HISTORY: Left rib fractures following fall. Evaluate for worsening  pneumothorax, pulmonary contusion or hydropneumothorax.     COMPARISON: 11/03/2021    FINDINGS:   Right-sided ventricular peritoneal shunt tubing.    Heart size normal. Lungs are clear with no focal consolidation, edema or  pneumothorax. No mediastinal or hilar enlargement or pleural effusions.    Minimally displaced left rib fracture #5, 6 and 7, better seen on CT.      Impression:        No acute cardiopulmonary disease.    Ardelia Mems, MD  11/04/2021 7:47 AM    XR Chest AP Portable [id number] Collected: 11/03/21 1742    Order Status: Completed Updated: 11/03/21 1745    Narrative:      HISTORY: hypoxemia, rib fractures     COMPARISON: Same day CT.    FINDINGS:     Left-sided rib fractures, not significantly displaced. No new pneumothorax.  No visible effusion by x-ray. Right lung is clear.      Impression:          Left-sided rib fractures, not significantly displaced. No new pneumothorax.  No visible effusion by x-ray.    Elpidio Eric, MD  11/03/2021 5:43 PM                ASSESSMENT/PLAN    Their debility stems from their L rib fracture, UTI , L hydropneumothorax, in the setting of CVA w/ aphasia and R hemiplegia, apraxia, afib,  NPH s/p VP shunt followed with Dr. Dillard Cannon neurology, hld, htn, comorbidities leading to musculoskeletal, and cardiopulmonary deconditioning.    Diagnosis/Active Problem List      1. Pain: controlled  2. Weakness/ataxia: Patient has cardiopulmonary and musculoskeletal deconditioning. Patient needs intensive PT/OT  to improve  their proximal muscle strength, fine muscle coordination and balance so they are able to transfer, ambulate and perform their ADLs safely.   3. Nutrition/ Diet: Patient will need protein and other nutrient supplementation on discharge.   4. Dispo:     SNF, patient is unsafe to return home, nor would they tolerate or benefit from 3 hours of therapy a day for possible discharge home.  Patient  will benefit from a longer stay at a subacute rehab facility.        I discussed with patient for the need for supervision and walker when standing and walking for safety and falls prevention.  Patient indicated understanding.    Continue all other medical management per primary team.  Patient can see me as an outpatient on discharge.    Thank you for your consult.  Please call with any questions.    Harmon Dun MD  Physical Medicine and  [facility]  Office: [address] [place], [place]  [place], [place] [address]  Office number: [phone]  Fax: [phone]

## 2021-11-05 ENCOUNTER — Telehealth: Payer: Self-pay

## 2021-11-05 ENCOUNTER — Inpatient Hospital Stay: Payer: Medicare Other

## 2021-11-05 DIAGNOSIS — R339 Retention of urine, unspecified: Secondary | ICD-10-CM

## 2021-11-05 DIAGNOSIS — N390 Urinary tract infection, site not specified: Secondary | ICD-10-CM

## 2021-11-05 DIAGNOSIS — S2242XD Multiple fractures of ribs, left side, subsequent encounter for fracture with routine healing: Secondary | ICD-10-CM

## 2021-11-05 DIAGNOSIS — I10 Essential (primary) hypertension: Secondary | ICD-10-CM

## 2021-11-05 DIAGNOSIS — B9620 Unspecified Escherichia coli [E. coli] as the cause of diseases classified elsewhere: Secondary | ICD-10-CM

## 2021-11-05 DIAGNOSIS — S272XXD Traumatic hemopneumothorax, subsequent encounter: Secondary | ICD-10-CM

## 2021-11-05 LAB — PHOSPHORUS: Phosphorus: 4.6 mg/dL (ref 2.3–4.7)

## 2021-11-05 LAB — BASIC METABOLIC PANEL
Anion Gap: 5 (ref 5.0–15.0)
BUN: 26 mg/dL (ref 9.0–28.0)
CO2: 24 mEq/L (ref 17–29)
Calcium: 8.7 mg/dL (ref 7.9–10.2)
Chloride: 111 mEq/L (ref 99–111)
Creatinine: 0.8 mg/dL (ref 0.5–1.5)
Glucose: 100 mg/dL (ref 70–100)
Potassium: 3.9 mEq/L (ref 3.5–5.3)
Sodium: 140 mEq/L (ref 135–145)
eGFR: >60 mL/min/{1.73_m2} (ref >=60)

## 2021-11-05 LAB — CBC
Absolute NRBC: 0 10*3/uL (ref 0.00–0.00)
Hematocrit: 35.2 % — ABNORMAL LOW (ref 37.6–49.6)
Hgb: 11.6 g/dL — ABNORMAL LOW (ref 12.5–17.1)
MCH: 30.4 pg (ref 25.1–33.5)
MCHC: 33 g/dL (ref 31.5–35.8)
MCV: 92.1 fL (ref 78.0–96.0)
MPV: 9.5 fL (ref 8.9–12.5)
Nucleated RBC: 0 /100 WBC (ref 0.0–0.0)
Platelets: 133 10*3/uL — ABNORMAL LOW (ref 142–346)
RBC: 3.82 10*6/uL — ABNORMAL LOW (ref 4.20–5.90)
RDW: 13 % (ref 11–15)
WBC: 5.8 10*3/uL (ref 3.10–9.50)

## 2021-11-05 LAB — MAGNESIUM: Magnesium: 2 mg/dL (ref 1.6–2.6)

## 2021-11-05 MED ORDER — AMOXICILLIN 500 MG PO CAPS
500.0000 mg | ORAL_CAPSULE | Freq: Three times a day (TID) | ORAL | 0 refills | Status: DC
Start: 2021-11-05 — End: 2021-11-05

## 2021-11-05 MED ORDER — OXYCODONE HCL 5 MG PO TABS
2.5000 mg | ORAL_TABLET | Freq: Four times a day (QID) | ORAL | 0 refills | Status: DC | PRN
Start: 2021-11-05 — End: 2021-11-05

## 2021-11-05 MED ORDER — AMOXICILLIN 500 MG PO CAPS
500.0000 mg | ORAL_CAPSULE | Freq: Three times a day (TID) | ORAL | 0 refills | Status: AC
Start: 2021-11-05 — End: 2021-11-10

## 2021-11-05 MED ORDER — TAMSULOSIN HCL 0.4 MG PO CAPS
0.4000 mg | ORAL_CAPSULE | Freq: Every day | ORAL | 0 refills | Status: DC
Start: 2021-11-05 — End: 2021-11-05

## 2021-11-05 MED ORDER — TAMSULOSIN HCL 0.4 MG PO CAPS
0.4000 mg | ORAL_CAPSULE | Freq: Every day | ORAL | 0 refills | Status: DC
Start: 2021-11-05 — End: 2023-04-03

## 2021-11-05 MED ORDER — LIDOCAINE 5 % EX PTCH
2.0000 | MEDICATED_PATCH | CUTANEOUS | 0 refills | Status: DC
Start: 2021-11-06 — End: 2023-04-03

## 2021-11-05 MED ORDER — RISAQUAD PO CAPS
1.0000 | ORAL_CAPSULE | Freq: Every day | ORAL | 0 refills | Status: AC
Start: 2021-11-06 — End: ?

## 2021-11-05 MED ORDER — RISAQUAD PO CAPS
1.0000 | ORAL_CAPSULE | Freq: Every day | ORAL | 0 refills | Status: DC
Start: 2021-11-06 — End: 2021-11-05

## 2021-11-05 MED ORDER — METHOCARBAMOL 500 MG PO TABS
500.0000 mg | ORAL_TABLET | Freq: Three times a day (TID) | ORAL | 0 refills | Status: DC
Start: 2021-11-05 — End: 2021-11-05

## 2021-11-05 MED ORDER — GABAPENTIN 100 MG PO CAPS
100.0000 mg | ORAL_CAPSULE | Freq: Three times a day (TID) | ORAL | 0 refills | Status: DC
Start: 2021-11-05 — End: 2023-04-03

## 2021-11-05 MED ORDER — OXYCODONE HCL 5 MG PO TABS
2.5000 mg | ORAL_TABLET | Freq: Four times a day (QID) | ORAL | 0 refills | Status: AC | PRN
Start: 2021-11-05 — End: 2021-11-12

## 2021-11-05 MED ORDER — AMOXICILLIN 500 MG PO CAPS
1000.0000 mg | ORAL_CAPSULE | Freq: Three times a day (TID) | ORAL | Status: DC
Start: 2021-11-05 — End: 2021-11-05
  Filled 2021-11-05 (×9): qty 2

## 2021-11-05 MED ORDER — METHOCARBAMOL 500 MG PO TABS
500.0000 mg | ORAL_TABLET | Freq: Three times a day (TID) | ORAL | 0 refills | Status: AC
Start: 2021-11-05 — End: ?

## 2021-11-05 MED ORDER — LIDOCAINE 5 % EX PTCH
2.0000 | MEDICATED_PATCH | CUTANEOUS | 0 refills | Status: DC
Start: 2021-11-05 — End: 2021-11-05

## 2021-11-05 MED ORDER — GABAPENTIN 100 MG PO CAPS
100.0000 mg | ORAL_CAPSULE | Freq: Three times a day (TID) | ORAL | 0 refills | Status: DC
Start: 2021-11-05 — End: 2021-11-05

## 2021-11-05 MED ORDER — AMOXICILLIN 500 MG PO CAPS
500.0000 mg | ORAL_CAPSULE | Freq: Three times a day (TID) | ORAL | Status: DC
Start: 2021-11-05 — End: 2021-11-05
  Administered 2021-11-05: 500 mg via ORAL
  Filled 2021-11-05 (×9): qty 1

## 2021-11-05 NOTE — Telephone Encounter (Signed)
[facility] called to confirm pt is on Robaxin Rx. Advised yes, per pt's med list. She offered no further questions.

## 2021-11-05 NOTE — Discharge Summary (Addendum)
Discharge Summary    Date:11/05/2021   Patient Name: [name],[name]  Attending Physician: Orville Govern, MD    Date of Admission:   11/03/2021    Date of Discharge:   11/05/2021    Admitting Diagnosis:   Status post fall with left-sided rib fractures 5 through 8 with small pneumothorax small hemothorax, acute cystitis    Discharge Dx:     Principal Diagnosis (Diagnosis after study, that is chiefly responsible for admission to inpatient status): Fall, initial encounter  [facility] Problems    Diagnosis POA    Principal Problem: Fall, initial encounter Yes    Closed fracture of multiple ribs of left side with routine healing Not Applicable    Primary hypertension Yes    Other hyperlipidemia Yes    Atrial fibrillation Yes    CVA (cerebral vascular accident) No    Aphasia No    Apraxia No      Resolved Hospital Problems   No resolved problems to display.   Also acute cystitis E. coli pansensitive  Urinary retention  Treatment Team:   Treatment Team:   Attending Provider: Orville Govern, MD  Consulting Physician: Harmon Dun, MD     Procedures performed:   Radiology: all results from this admission  XR Chest AP Portable    Result Date: 11/05/2021  1. Tiny left apical pneumothorax. 2. Mild left basilar atelectasis. Loel Ro, MD 11/05/2021 7:12 AM    XR Chest AP Portable    Result Date: 11/04/2021  No acute cardiopulmonary disease. Ardelia Mems, MD 11/04/2021 7:47 AM    XR Chest AP Portable    Result Date: 11/03/2021  Left-sided rib fractures, not significantly displaced. No new pneumothorax. No visible effusion by x-ray. Elpidio Eric, MD 11/03/2021 5:43 PM    CT L- Spine without Contrast    Result Date: 11/03/2021   1.Chronic appearing compression fractures at L1 and L2. 2. No acute fractures are seen. 3. Degenerative facet changes are noted with canal stenosis at L3-L4 and L4-L5. Oren Binet, MD 11/03/2021 7:50 AM    CT Chest without Contrast    Result Date: 11/03/2021  1.Mildly displaced left 5-8 rib  fractures with trace left hydropneumothorax and dependent atelectasis. 2.No acute fracture or traumatic subluxation of the thoracic spine. 3.Additional chronic and incidental findings are detailed above. Blanchard Mane 11/03/2021 7:49 AM    CT Reconstruction T-spine    Result Date: 11/03/2021  1.Mildly displaced left 5-8 rib fractures with trace left hydropneumothorax and dependent atelectasis. 2.No acute fracture or traumatic subluxation of the thoracic spine. 3.Additional chronic and incidental findings are detailed above. 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 2. There is no herniation or intracranial hemorrhage. 3.There is no definite acute intracranial abnormality. 4. Old left frontal infarct. Oren Binet, MD 11/03/2021 7:39 AM    CT Cervical Spine without Contrast    Result Date: 11/03/2021   1.No acute fracture or malalignment. 2.Tiny left apical pneumothorax. Please refer to the separately dictated CT of the chest. 3.Right mastoid effusion. Blanchard Mane 11/03/2021 7:38 AM    XR Chest  AP Portable    Result Date: 11/03/2021   1.No definite acute traumatic abnormality. 2. The lungs are clear. There is no detectable pneumothorax. Oren Binet, MD 11/03/2021 7:10 AM    US Venous Dopp Low Extrem Comp Bilat    Result Date: 10/19/2021  No sonographic evidence for right or left lower extremity deep venous thrombosis. Nicholes Rough, MD 10/19/2021 10:56 AM    Chest AP Portable    Result Date: 10/19/2021  Mild bibasilar atelectasis. Noel Journey, MD 10/19/2021 7:16 AM   Surgery: all results from this admission  * No surgery found *    Reason for Admission:   Left-sided chest pain status post fall with small hemopneumothorax for rib fractures on the left  Hospital Course:     The patient was admitted on transfer from [facility] after evaluation after fall from his facility where he sustained 4 rib fractures on the left with a small  hemopneumothorax.  Admission UA also showed positive white cells which ultimately turned out to be a pansensitive E. coli urinary tract infection.  Patient also had issues with urinary retention likely secondary to the UTI and ultimately required a Foley to be placed the night before discharge.  This should be left in place for at least 48 hours before attempts at removal.  Flomax is also been ordered for him  We performed multidisciplinary ICU rounds at 1030 and I saw him at 1110.  I spoke to the patient's power of attorney daughter by telephone who is currently in [place] and updated her with plans for discharge and his injuries and infections and Foley catheter that will be removed at the outside facility in 48 hours  Condition at Discharge:   Stable and improved  Today:     BP 128/70   Pulse 85   Temp 98 F (36.7 C) (Temporal)   Resp 18   Ht 1.753 m (5' 9"$ )   Wt 83.1 kg (183 lb 3.2 oz)   SpO2 98%   BMI 27.05 kg/m   Ranges for the last 24 hours:  Temp:  [97 F (36.1 C)-98 F (36.7 C)] 98 F (36.7 C)  Heart Rate:  [57-100] 85  Resp Rate:  [11-23] 18  BP: (106-136)/(50-74) 128/70    Last set of labs   Recent Labs   Lab 11/05/21  0331   WBC 5.80   Hgb 11.6*   Hematocrit 35.2*   Platelets 133*     Recent Labs   Lab 11/05/21  0331   Sodium 140   Potassium 3.9   Chloride 111   CO2 24   BUN 26.0   Creatinine 0.8   eGFR >60.0   Glucose 100   Calcium 8.7   Lungs clear to auscultation bilaterally, stable lower left lateral chest wall pain no crepitus  Heart regular in rate or rhythm currently not a regular  Abdomen   soft, non distended, non tender, no hernias  Extremities- no edema or tenderness  Back no cva tenderness  Skin not jaundiced, not pale  Sclera non icteric  Neurologic gcs 15, moving all extremities to command  Oropharynx clear and moist  Neck trachea midline   Foley in place with clear yellow urine    Micro / Labs / Path pending:     Unresulted Labs       None            Discharge Instructions For  Providers     Rib fracture guidelines             Discharge Instructions:      Follow-up Information       Harmon Dun, MD Follow up in 2 week(s).    Specialty: Physical Medicine/Rehabilitation  Contact information:  [address]  [address]  [place] [address]  [phone]               Mahala Menghini, MD .    Specialty: Internal Medicine  Contact information:  [address]  [address]  [place] [address]  [facility] Follow up.    Specialty: Home Health Services  Why: home health services  Contact information:  [place], [place]  [phone]                           Discharge Diet: Regular Diet          Disposition: Skilled nursing facility     Discharge Medication List        Taking      acetaminophen 325 MG tablet  Dose: 650 mg  Commonly known as: TYLENOL  2 tablets (650 mg) every 4 (four) hours as needed     amLODIPine 10 MG tablet  Dose: 10 mg  Commonly known as: NORVASC  Take 1 tablet (10 mg) by mouth daily     amoxicillin 500 MG capsule  Dose: 500 mg  Commonly known as: AMOXIL  Take 1 capsule (500 mg) by mouth every 8 (eight) hours for 5 days     aspirin EC 81 MG EC tablet  Dose: 1 tablet  Take 1 tablet (81 mg) by mouth daily     atorvastatin 40 MG tablet  Dose: 1 tablet  Commonly known as: LIPITOR  Take 1 tablet (40 mg) by mouth nightly     cyanocobalamin 500 MCG tablet  Commonly known as: VITAMIN B12  Take by mouth     gabapentin 100 MG capsule  Dose: 100 mg  Commonly known as: NEURONTIN  Take 1 capsule (100 mg) by mouth every 8 (eight) hours     lactobacillus/streptococcus Caps  Dose: 1 capsule  Start taking on: November 06, 2021  Take 1 capsule by mouth daily     lidocaine 5 %  Dose: 2 patch  Commonly known as: LIDODERM  Start taking on: November 06, 2021  Place 2 patches onto the skin every 24 hours Remove & Discard patch within 12 hours or as directed by MD     methocarbamol 500 MG tablet  Dose: 500 mg  Commonly known as:  ROBAXIN  Take 1 tablet (500 mg) by mouth every 8 (eight) hours     oxyCODONE 5 MG immediate release tablet  Dose: 2.5 mg  Commonly known as: ROXICODONE  Take 0.5 tablets (2.5 mg) by mouth every 6 (six) hours as needed for Pain     rivaroxaban 10 MG Tabs  Dose: 1 tablet  Commonly known as: XARELTO  Take 1 tablet (10 mg) by mouth daily     tamsulosin 0.4 MG Caps  Dose: 0.4 mg  Commonly known as: FLOMAX  Take 1 capsule (0.4 mg) by mouth Daily after dinner     vitamins/minerals Tabs  Dose: 1 tablet  Take 1 tablet by mouth daily            Minutes spent coordinating discharge and reviewing discharge plan: 40 minutes      Signed by: Orville Govern, MD

## 2021-11-05 NOTE — Telephone Encounter (Signed)
Pharmacy called stating they received Rxs and then received cancellations.  Pharmacy calling to confirm the cancellations.  Noted in Epic and confirmed.  Caller offered no further questions.

## 2021-11-05 NOTE — Discharge Summary -  Nursing (Addendum)
Patient has been medically cleared for discharge. The AVS was faxed and sent to [facility] assisted living. RN followed up & reviewed the discharge summary with a Marine scientist. They are aware of his wheelchair [facility]ne Lei pick up time. The discharge summary was also discussed with the patient. He is aware of follow up appointments & his new medications & has no further questions. Medications were delivered to bedside & will be transported with the patient. Prior to discharge. IV catheters were removed with tips intact. Foley will remain in place upon discharge.

## 2021-11-05 NOTE — Plan of Care (Signed)
Problem: Moderate/High Fall Risk Score >5  Goal: Patient will remain free of falls  Outcome: Progressing     Problem: Safety  Goal: Patient will be free from injury during hospitalization  Outcome: Progressing  Goal: Patient will be free from infection during hospitalization  Outcome: Progressing     Problem: Pain  Goal: Pain at adequate level as identified by patient  Outcome: Progressing     Problem: Side Effects from Pain Analgesia  Goal: Patient will experience minimal side effects of analgesic therapy  Outcome: Progressing     Problem: Discharge Barriers  Goal: Patient will be discharged home or other facility with appropriate resources  Outcome: Progressing     Problem: Psychosocial and Spiritual Needs  Goal: Demonstrates ability to cope with hospitalization/illness  Outcome: Progressing     Problem: Compromised Hemodynamic Status  Goal: Vital signs and fluid balance maintained/improved  Outcome: Progressing     Problem: Inadequate Gas Exchange  Goal: Adequate oxygenation and improved ventilation  Outcome: Progressing  Goal: Patent Airway maintained  Outcome: Progressing     Problem: Inadequate Airway Clearance  Goal: Normal respiratory rate/effort achieved/maintained  Outcome: Progressing     Problem: Inadequate Tissue Perfusion-Venous  Goal: Tissue perfusion is adequate-venous  Outcome: Progressing     Problem: Impaired Mobility  Goal: Mobility/Activity is maintained at optimal level for patient  Outcome: Progressing     Problem: Nutrition  Goal: Nutritional intake is adequate  Outcome: Progressing     Problem: Compromised Tissue integrity  Goal: Damaged tissue is healing and protected  Outcome: Progressing  Goal: Nutritional status is improving  Outcome: Progressing

## 2021-11-05 NOTE — Progress Notes (Signed)
Spoke to daughter, Cyril Mourning re: d/c arrangements back to the ALF. Spoke to [place], [place] Nurse and faxed her the AVS and d/c summary (F # [phone]) and notified her of transport time.        11/05/21 1306   Discharge Disposition   Patient preference/choice provided? Yes   Physical Discharge Disposition Assisted Living Facility  ([facility] with [facility] services)   Name of [facility] - [phone]   Name of [facility] Placement [facility]   Mode of Transportation Wheelchair [facility]  ([facility]&M W/C [facility]ian[facility] Lei transport under private pay.)   Pick up time 2:30 p.m   Patient/Family/POA notified of transfer plan Yes   Patient agreeable to discharge plan/expected d/c date? Yes   Family/POA agreeable to discharge plan/expected d/c date? Yes   Bedside nurse notified of transport plan? Yes   [facility] PT/OT/ST   CM Interventions   Follow up appointment scheduled? No   Reason no follow up scheduled? Family to schedule   Referral made for home health RN visit? Yes   Multidisciplinary rounds/family meeting before d/c? Yes   Medicare Checklist   Is this a Medicare patient? Yes   If LOS 3 days or greater, did patient received 2nd IMM Letter? Yes   Date of 2nd IMM Letter 11/05/21

## 2021-11-05 NOTE — Progress Notes (Signed)
CM Progress note:    D/C Disposition: [facility]F w/ Home P.T/O.T services  Anticipated D/C Date: 1 to 2 days    Spoke to daughter, Cyril Mourning about P.T/O.T recs for pt to return to the ALF w/ H.H services and she is agreeable. Spoke to [place], [place] Nurse and also faxed her the P.T/O.T evals (F # [phone]) and she is also agreeable for pt to return to the ALF. Met w/ pt at bedside and he seems to be looking forward to returning to the ALF. CM will refer pt to [facility] for home P.T/O.T services.     Norton Blizzard MSW, [facility]  Case Manager  [phone]

## 2021-11-05 NOTE — Plan of Care (Signed)
STICU DAILY CHECKLIST           Analgesia/sedation:         Pain well controlled:   [x]$  Yes    []$  No  Continuous Sedation: []$  Yes    [x]$  No    Sedation vacation:      []$  Yes    []$  No   [x]$  N/A   Last CPOT:    Last RASS: RASS Score: Alert and Calm    Restraint(s):  [x]$  N/A    []$  Discontinue    []$  Start/Continue to prevent self-harm   Delirium:  Positive or Negative for Delirium?:Positive or Negative for Delirium: Negative  CAM-ICU:     []$  N/A    []$  Discontinue    [x]$  Start/Continue     Respiratory:              []$  Mechanical Ventilation, Days __  [x]$  N/A   Readiness to wean & extubate (SAT/SBT):    []$  Yes   []$  No  [x]$  N/A   Needs Trach:    []$  Yes    []$  No   [x]$  N/A   HOB 30-45 degrees  [x]$  Yes    []$  No, reason:     Gasterointestinal:    Bowel Regimen:    [x]$  Yes    []$  No  Nutrition at goal?   [x]$  Yes    []$  No   []$  N/A   Ulcer Prophylaxis: [x]$  Not indicated  []$  Discontinue    []$  Start/Continue  Rectal tube:           [x]$  N/A    []$  Discontinue    []$  Insert/Continue, Days   Glucose controlled [x]$  Yes    []$  No     Genitourinary:   Electrolyte protocol ordered:  [x]$  Yes  []$  N/A   Fluids:   []$  Start/Continue  []$  Discontinue   [x]$  N/A     Hospital-Acquired Infections:  Foley:    [x]$  None   []$  Discontinue    []$  Insert/Continue, Days__  Lines/Tubes:  [x]$  NA  []$  CVC []$  Aline []$  PICC/midline []$  Surgical drain []$  Chest tube []$  Other  []$  Discontinue   []$  Insert/Continue, Days__     Antibiotics:  []$  N/A   [x]$  Stop date ordered    [x]$  Appropriate for current cultures  Fever protocol ordered:    [x]$ Yes  []$ N/A     MSK:  Cervical spine clearance reviewed: [x]$  Yes  []$  N/A   PT/OT appropriate:  [x]$ Start/Continue  []$ No   []$ N/A   Activity (PT/OT):    []$ Bedrest    [x]$ OOB/Ambulate  Current Mobility Level:   PMP Activity: Step 3 - Bed Mobility (11/05/2021  8:00 AM)    VTE Prevention:     [x]$  SCDs    [x]$  Chemical prophylaxis    []$  Therapeutic AC      []$  On hold:     All labs/images reviewed: [x]$  Yes  []$  N/A   Daily labs/images ordered:   [x]$  Yes  []$  N/A   Home medications reviewed and started if appropriate: [x]$  Yes   []$  No  Family updated:    [x]$  Yes  []$  N/A     Netta Neat, PA

## 2021-11-05 NOTE — OT Progress Note (Signed)
Occupational Therapy Treatment  Patient: [name]    ST04/ST04.A  Discharge Recommendations:   Based on today's session: ALF;Home with home health OT     If pt returns home, DME Recommended for Discharge: Patient already has needed equipment    Unit: [facility]     MRN#:  [id number]    OT Received On: 11/05/21  Start Time: [id number]  Stop Time: [id number]  Time Calculation (min): 34 min    OT Visit Number: 2    Precautions and Contraindications:    Precautions  Other Precautions: Falls, L rib fractures, hx of CVA with R sided weakness         Assessment: Patient demonstrated increased activity tolerance as evidenced by standing x2 trials from chair ~ 2 minutes each. Patient will continue to benefit from occupational therapy services to address decreased strength/ROM, balance deficits, decreased independence with ADLs/IADLs, and decreased endurance/activity tolerance.    Prognosis: Good;With continued OT s/p acute discharge  Progress: Progressing toward goals    Goal Formulation: Patient  Time For Goal Achievement: by time of discharge  ADL Goals  Patient will groom self: Stand by Assist, at edge of bed, 5 visits  Patient will dress upper body: Minimal Assist, 5 visits  Mobility and Transfer Goals  Pt will transfer bed to [facility]: Contact Guard Assist, with rolling walker, 5 visits                          Plan: Continue with Occupational therapy services in acute care to address independence with ADLs. Focus next therapy session on functional transfers and upper body dressing.      Treatment Interventions: ADL retraining;Functional transfer training;UE strengthening/ROM;Neuro muscular reeducation  Risks/Benefits/POC Discussed with Pt/Family: With patient  OT Frequency Recommended: 2-3x/wk    Subjective:   Patient's medical condition is appropriate for Occupational Therapy intervention at this time.  Patient is agreeable to participation in the therapy session. Nursing clears patient  for therapy.       Objective:  Observation of Patient/Vital Signs:  Patient is seated in a bedside chair with telemetry, peripheral IV, and condom catheter in place.    Cognition/Neuro Status  Arousal/Alertness: Appropriate responses to stimuli  Attention Span: Appears intact  Orientation Level: Oriented to person (Did not ask further orientation questions)  Behavior: calm;cooperative  Motor Planning: decreased processing speed;decreased initiation    Functional Mobility  Sit to Stand Transfers: Moderate Assist with RW from chair; with increased time  Stand to Sit Transfers: Moderate Assist with RW to chair; use of arm rests for controlled lowering onto chair  Functional Mobility/Ambulation: Moderate Assist with RW in room; few steps forward and backward; increased verbal instruction for sequencing    Self-care and Home Management  Grooming: Stand by Assist;in chair;teeth care  LB Dressing: Maximal Assist;sitting;Don/doff R shoe;Don/doff L shoe             AM-PACT "6 Clicks" Daily Activity Inpatient Short Form  Inpatient AM-PACT Performed?: yes  Put On/Take Off Lower Body Clothing: Total  Assist with Bathing: Total  Assist with Toileting: Total  Put On/Take Off Upper Body Clothing: A lot  Assist with Grooming: A little  Assist with Eating: None  OT Daily Activity Raw Score: 12  CMS 0-100% Score: 66.57%    Treatment Activities:     Educated the patient to role of occupational therapy, plan of care, goals of therapy and HEP, and safety with mobility  and ADLs. Donned shoes while sitting in chair in prep for functional mobility. Patient able to stand from chair x2 trials with RW for ~2 minutes each trial and complete static marching. Educated patient on importance of sitting OOB to increase overall activity tolerance and to promote pulmonary hygiene.    Patient left without needs and call bell within reach.  Chair alarm set.      Therapist PPE during session gloves    Rudi Coco, OTR/L  [facility]  Physical Medicine and Rehab Department

## 2021-11-05 NOTE — Progress Notes (Signed)
Home Health Referral    Referral from [place] (Case Manager) for home health care upon discharge.            By [employer], the patient has the right to freely choose a home care provider.  Arrangements have been made with:    A company of the patients choosing. We have supplied the patient with a listing of providers in your area who asked to be included and participate in Medicare.  The preferred provider of your insurance company. Choosing a home care provider other than your insurance company's preferred provider may affect your insurance coverage.      Home Health Discharge Information     Your doctor has ordered Physical Therapy and Occupational Therapy in-home service(s) for you while you recuperate at home, to assist you in the transition from hospital to home.      The agency that you or your representative chose to provide the service:     [facility]         Additional comments:   If you have not heard from your home health agency within 24-48 hours after discharge please call your agency to arrange a time for your first visit.  For any scheduling concerns or questions related to home health, such as time or date please contact your home health agency at the number listed above.          Home Health face-to-face (FTF) Encounter (Order [id number])  Consult  Date: 11/05/2021 Department: [facility] Trauma Intensive Care Ordering/Authorizing: Orville Govern, MD     Order Information    Order Date/Time Release Date/Time Start Date/Time End Date/Time   11/05/21 11:19 AM None 11/05/21 11:14 AM 11/05/21 11:14 AM     Order Details    Frequency Duration Priority Order Class   Once 1  occurrence Routine Hospital Performed     Standing Order Information    Remaining Occurrences Interval Last Released     0/1 Once 11/05/2021              Provider Information    Ordering User Ordering Provider Authorizing Provider   Chaney Malling, RN Pullarkat, Aggie Moats, MD Pullarkat, Aggie Moats, MD    Attending Provider(s) Admitting Provider PCP   Beverely Pace, MD; Orville Govern, MD Pullarkat, Aggie Moats, MD Mahala Menghini, MD     Verbal Order Info    Action Created on Order Mode Entered by Responsible Provider Signed by Signed on   Ordering 11/05/21 1119 Telephone with Kathryne Sharper, RN Pullarkat, Aggie Moats, MD             Comments    Home PT/OT required for gait and balance training, strengthening, mobility, fall prevention, and ADL training. Status post fall with left-sided rib fractures 5 through 8 with small pneumothorax small hemothorax, acute cystitis, gen weakness                Home Health face-to-face (FTF) Encounter: Patient Communication     Not Released  Not seen         Order Questions    Question Answer   Date I saw the patient face-to-face: 11/05/2021   Evidence this patient is homebound because: C.  Decreased endurance, strength, ROM, cadence, safety/judgment during mobility    G.  Fall risk due to impaired coordination, gait and decreased balance    J.  Advanced age with frailty factors affecting safe ambulation & needs supervision  Medical conditions that necessitate Home Health care: B.  Functional impairment due to recent hospitalization/procedure/treatment    C.  Risk for complication/infection/pain requiring follow up and monitoring    F.  New diagnosis & treatment requiring follow up monitoring and management    H.  Multiple new medications requiring management and monitoring   Per clinical findings, following services are medically necessary: PT    OT   Clinical findings that support the need for Physical Therapy. PT will A.  Evaluate and treat functional impairment and improve mobility   OT will provide assistance with: Home program to improve ability to perform ADLs                    Process Instructions    Please select Home Care Services medically necessary.    Based on the above findings, I certify that this patient is confined to the home and needs intermittent  skilled nursing care, physical therapry and / or speech therapy or continues to need occupational therapy. The patient is under my care, and I have initiated the establishment of the plan of care. This patient will be followed by a physician who will periodically review the plan of care.     Collection Information            Consult Order Info    ID Description Priority Start Date Start Time   [id number] [place] face-to-face (FTF) Encounter Routine 11/05/2021 11:14 AM   Provider Specialty Referred to   ______________________________________ _____________________________________                         Verbal Order Info    Action Created on Order Mode Entered by Responsible Provider Signed by Signed on   Ordering 11/05/21 1119 Telephone with Kathryne Sharper, RN Pullarkat, Aggie Moats, MD             Patient Information    Patient Name  [name], [name] Legal Sex  Male DOB  05-28-1945       Reprint Order [place] face-to-face (FTF) Encounter (Order [id number]) on 11/05/21       Additional Information    Associated Reports External References   Priority and Order Details [place]        Patient Name: [name], [name]    MRN: [id number]     CSN: [id number]         [place] #   [id number] Patient Class   Inpatient Service  Trauma Accommodation Code  Intensive Care      Admission Information     Admitting Physician:  Attending Physician: Orville Govern, MD  Pullarkat, Aggie Moats, MD Unit  [facility] STICU L&D Status      Admitting Diagnosis: Traumatic pneumothorax, initial encounter; Fall, initial encounter; Closed* Room / Bed  ST04/ST04.A L&D - Last Menstrual Cycle      Chief Complaint: Rib Injury     Admit Type:  Admit Date/Time:  Discharge Date/Time: Emergency  11/03/2021 / [id number]   /  Length of Stay: 2 Days    L&D EDD   Estimated Date of Delivery: None noted.      Patient Information              Home Address: [address]  [place]  [address] Employer:  Employer Address:       ,  Main Phone: [phone] Employer Phone:     SSN: [id number]       DOB: [birth year] (89 yrs)       Sex: Male Primary Care Physician: Mahala Menghini, MD   Marital Status: Unknown Referring Physician:       No ref. provider found   Race: White or Caucasian       Ethnicity: Not of Hispanic/Latino/S*       Emergency Contacts  Name Home Phone Work Phone Mobile Phone Relationship Blanchard Kelch     [phone] Daughter     Laroy Apple     [phone] Son in law           Guarantor Information     Guarantor Name: [name], [name] ID: [id number]   Guarantor Relationship to Pt: Self Guarantor Type: Personal/Family   Guarantor DOB:    Feb 28, 1945 Billing Indicator:        Guarantor Address: [address]   [place], [place] [address]          Guarantor Home Phone: [phone] Guarantor Employer:        Guarantor Work Phone:   Special educational needs teacher Emp Phone:                     Lexicographer Name: [place] Name: [employer] Address:    [address] [place], [place] [id number] Subscriber DOB: [birth year]      Subscriber ID: [id number] Phone:   Pt Relationship to Sub:   Self   Insurance ID:         Group Name:   Preauthorization #: [place]   Group #:   Preauthorization Days:        Clinical research associate Name: [place] A TO [place] Name: [employer] Address:    [address] [place], [place] SSN-[id number] Subscriber DOB: 11-Jun-1945      Subscriber ID: [id number]   Insurance Phone:   Pt Relationship to Sub:   Self   Insurance ID:         Group Name:   Preauthorization #:     Group #:   Preauthorization Days:

## 2021-11-10 ENCOUNTER — Emergency Department
Admission: EM | Admit: 2021-11-10 | Discharge: 2021-11-10 | Disposition: A | Discharge status: Skilled Nursing Facility | Payer: Medicare Other | Attending: Emergency Medicine | Admitting: Emergency Medicine

## 2021-11-10 DIAGNOSIS — T839XXA Unspecified complication of genitourinary prosthetic device, implant and graft, initial encounter: Secondary | ICD-10-CM | POA: Insufficient documentation

## 2021-11-10 DIAGNOSIS — Z7689 Persons encountering health services in other specified circumstances: Secondary | ICD-10-CM

## 2021-11-10 MED ORDER — ACETAMINOPHEN 325 MG PO TABS
650.0000 mg | ORAL_TABLET | Freq: Once | ORAL | Status: AC
Start: 2021-11-10 — End: 2021-11-10
  Administered 2021-11-10: 650 mg via ORAL
  Filled 2021-11-10: qty 2

## 2021-11-10 NOTE — ED Notes (Signed)
Patient resting in bed, awaiting transport [facility] to [facility].

## 2021-11-10 NOTE — Discharge Instructions (Signed)
Patient was seen and evaluated in the emergency department due to concern that his Foley catheter had a clot in it and might not be working properly.  The Foley catheter was draining upon the patient's arrival with no presence of clots.  I reviewed the patient's medical record and the catheter was supposed to stay in place for 48 hours after discharge and be removed following this. [name] was 11/3. Patient is telling me that the plan was for the catheter to be removed today and that he would like it to come out. I called and spoke with his facility and I am being told the same information that the plan was to remove it today or tomorrow. I did discuss that there was a risk of urinary retention with the patient if we removed the catheter and that it might need to be reinserted. He is ok with this. I was told by staff at his facility that they would assist him with toileting despite the catheter.

## 2021-11-10 NOTE — ED Triage Notes (Signed)
Berneta Sages, BiBa with clots in his foley catheter. Upon arrival, foley catheter is draining a yellow clear urine. No clots observed on the catheter. Patient is not complaining of pain as well. Denies any fever, SOB or chest pain.

## 2021-11-10 NOTE — ED Provider Notes (Signed)
History     Chief Complaint   Patient presents with    Foley problem     The history is provided by the patient and the EMS personnel (Staff at patient's living facility).        76 yo M with h/o CVA atrial fibrillation on Xarelto, discharged from the hospital November 3 after a fall with 4 rib fractures had a urinary tract infection.  Patient tells me that his Foley catheter was supposed to be taken out today or tomorrow he would like to have it out.  Tonight the call was made to EMS due to noticing a dark area that could have been a clot inside of his Foley catheter tubing.  Patient has no acute complaints no abdominal pain no nausea vomiting no fever.  Is not having penile discomfort.  Tells me that he is having regular bowel movements.  States that he still having pain from the rib fractures but he has been able to get up and move around a little bit.    Past Medical History:   Diagnosis Date    Aphasia     Apraxia     Atrial fibrillation     Chronic pain     CVA (cerebral vascular accident)     Hemiplegia and hemiparesis following cerebral infarction affecting right dominant side     HLD (hyperlipidemia)     Major depression        Past Surgical History:   Procedure Laterality Date    VENTRICULOPERITONEAL SHUNT  2018    shunt revision 2022       Family History   Problem Relation Age of Onset    Cancer Mother     Cancer Father     Hypertension Brother        Social  Social History     Tobacco Use    Smoking status: Former     Types: Cigarettes     Quit date: 1971     Years since quitting: 52.8    Smokeless tobacco: Never   Vaping Use    Vaping Use: Never used   Substance Use Topics    Alcohol use: Not Currently    Drug use: Never       .     Allergies   Allergen Reactions    Ace Inhibitors      Other reaction(s): Cough       Home Medications               acetaminophen (TYLENOL) 325 MG tablet     2 tablets (650 mg) every 4 (four) hours as needed     amLODIPine (NORVASC) 10 MG tablet     Take 1 tablet (10  mg) by mouth daily     amoxicillin (AMOXIL) 500 MG capsule     Take 1 capsule (500 mg) by mouth every 8 (eight) hours for 5 days     aspirin EC 81 MG EC tablet     Take 1 tablet (81 mg) by mouth daily     atorvastatin (LIPITOR) 40 MG tablet     Take 1 tablet (40 mg) by mouth nightly     cyanocobalamin (VITAMIN B12) 500 MCG tablet     Take by mouth     gabapentin (NEURONTIN) 100 MG capsule     Take 1 capsule (100 mg) by mouth every 8 (eight) hours     lactobacillus/streptococcus (RISAQUAD) Cap     Take 1 capsule by mouth  daily     lidocaine (LIDODERM) 5 %     Place 2 patches onto the skin every 24 hours Remove & Discard patch within 12 hours or as directed by MD     methocarbamol (ROBAXIN) 500 MG tablet     Take 1 tablet (500 mg) by mouth every 8 (eight) hours     oxyCODONE (ROXICODONE) 5 MG immediate release tablet     Take 0.5 tablets (2.5 mg) by mouth every 6 (six) hours as needed for Pain     rivaroxaban (XARELTO) 10 MG Tab     Take 1 tablet (10 mg) by mouth daily     tamsulosin (FLOMAX) 0.4 MG Cap     Take 1 capsule (0.4 mg) by mouth Daily after dinner     vitamins/minerals Tab     Take 1 tablet by mouth daily             Review of Systems   Constitutional:  Negative for chills and fever.   Respiratory:  Negative for shortness of breath.    Cardiovascular:  Negative for chest pain.   Gastrointestinal:  Negative for abdominal pain, nausea and vomiting.   Genitourinary:  Negative for penile discharge, penile pain, penile swelling, scrotal swelling and testicular pain.   Skin:  Negative for rash.   All other systems reviewed and are negative.      Physical Exam    BP: 122/66, Heart Rate: 78, Temp: 98.2 F (36.8 C), Resp Rate: 17, SpO2: 95 %, Weight: 89.7 kg    Physical Exam  Vitals and nursing note reviewed.   Constitutional:       General: He is not in acute distress.     Appearance: Normal appearance. He is well-developed. He is not ill-appearing or toxic-appearing.      Comments: Elderly appearing appears  chronically ill no acute distress   HENT:      Head: Normocephalic.      Mouth/Throat:      Mouth: Mucous membranes are moist.   Eyes:      Conjunctiva/sclera: Conjunctivae normal.   Cardiovascular:      Rate and Rhythm: Normal rate and regular rhythm.      Pulses: Normal pulses.      Heart sounds: Normal heart sounds.   Pulmonary:      Effort: Pulmonary effort is normal.      Breath sounds: Normal breath sounds.   Abdominal:      General: There is no distension.      Palpations: Abdomen is soft.      Tenderness: There is no abdominal tenderness.   Genitourinary:     Comments: Slight area of skin breakdown at the tip of the meatus likely from the Foley catheter there is no bleeding there is no blood in his Foley catheter tube no blood in the bag  Skin:     General: Skin is warm and dry.   Neurological:      Mental Status: He is alert and oriented to person, place, and time.   Psychiatric:         Mood and Affect: Mood normal.           MDM and ED Course     ED Medication Orders (From admission, onward)      Start Ordered     Status Ordering Provider    11/10/21 0601 11/10/21 0600  acetaminophen (TYLENOL) tablet 650 mg  Once        Route: Oral  Ordered Dose: 650 mg       Last MAR action: Given Kandance Yano A               Medical Decision Making  Risk  OTC drugs.      76 year old male here in the emergency department due to possible blood clot in his Foley catheter tube    Patient is hemodynamically stable, afebrile, no acute distress  Benign abdominal exam  The Foley catheter has no clot in it and there is no blood in his bag I am not sure what the staff was seeing but the catheter is draining well with clear urine  On chart review the patient was supposed to have the Foley catheter removed 48 hours after his discharge which would have but this summer around the fifth of the sixth  Today is the eighth  I called and spoke with his staff they tell me that the plan was for the catheter to be removed today  Told the  patient that he could have retention again.  Foley catheter removed  Staff aware and tell me they will be able to hep him with toileting     Pertinent labs and imaging studies reviewed. (See chart for details)         The encounter diagnosis was Encounter for assessment of Foley catheter.    Number and Complexity of Problems Addressed (select at least one)    Complexity: Moderate: 1 undiagnosed new problem with uncertain prognosis      Differential diagnosis: Patient here for Foley catheter check catheter found to be working but opted to remove the Foley catheter based on patient's previous plan of care  Review of ED/outpatient records: Yes reviewed discharge summary from patient's recent admission see MDM  Historians that case was discussed with: Yes spoke with staff at his living facility staff who called and thought that there was a clot in his catheter  My EKG interpretation: If interpreted see interpretation above in ED course  Imaging interpretation by myself: No  Independent interpretation of labs by myself: No  Discussion with other physicians/providers: No  Tests considered, but not ordered: No  Disposition decision making/shared decision making: See MDM  Chronic conditions affecting care: See MDM  Prescription medications given or considered: No  Social determinants of health impacting treatment or disposition: No  Risk of Complications and/or Morbidity or Mortality of Patient Management: High  CODE STATUS and discussions: No    DR. Elvina Mattes is the primary attending for this patient and has obtained and performed the history, PE, and medical decision making for this patient.      Results       ** No results found for the last 24 hours. **            Radiology Results (24 Hour)       ** No results found for the last 24 hours. **            *This note was generated by the Epic EMR system/ Dragon speech recognition and may contain inherent errors or omissions not intended by the user. Grammatical errors,  random word insertions, deletions, pronoun errors and incomplete sentences are occasional consequences of this technology due to software limitations. Not all errors are caught or corrected. If there are questions or concerns about the content of this note or information contained within the body of this dictation they should be addressed directly with the author for clarification  Procedures    Clinical Impression & Disposition     Clinical Impression  Final diagnoses:   Encounter for assessment of Foley catheter        ED Disposition       ED Disposition   Discharge    Condition   --    Date/Time   Wed Nov 10, 2021  6:17 AM    Comment   Altamease Oiler discharge to home/self care.    Condition at disposition: Stable                  New Prescriptions    No medications on file                   Beverely Pace, MD  11/10/21 [phone]

## 2021-11-22 ENCOUNTER — Other Ambulatory Visit (INDEPENDENT_AMBULATORY_CARE_PROVIDER_SITE_OTHER): Payer: Self-pay | Admitting: Surgical Critical Care

## 2022-01-26 ENCOUNTER — Other Ambulatory Visit: Payer: Self-pay | Admitting: Medical

## 2022-01-26 DIAGNOSIS — H90A21 Sensorineural hearing loss, unilateral, right ear, with restricted hearing on the contralateral side: Secondary | ICD-10-CM

## 2022-01-26 DIAGNOSIS — H90A32 Mixed conductive and sensorineural hearing loss, unilateral, left ear with restricted hearing on the contralateral side: Secondary | ICD-10-CM

## 2022-02-23 NOTE — Progress Notes (Signed)
I left the patient a message to call back and speak with me directly regarding his shunt appointment. Please echat me or transfer when he calls back.

## 2022-02-24 NOTE — Progress Notes (Signed)
Patient can be seen by [facility] 3/7 , or Stacia 3/11 at [facility]  . Patient can also see Stacia on 3/13 in [place] @9$ :00 per Providers when he calls back.

## 2022-03-16 ENCOUNTER — Encounter: Payer: Self-pay | Admitting: Nurse Practitioner

## 2022-03-16 ENCOUNTER — Ambulatory Visit: Payer: Medicare Other | Attending: Nurse Practitioner | Admitting: Nurse Practitioner

## 2022-03-16 ENCOUNTER — Other Ambulatory Visit (INDEPENDENT_AMBULATORY_CARE_PROVIDER_SITE_OTHER): Payer: Self-pay | Admitting: Medical

## 2022-03-16 VITALS — BP 110/67 | HR 64 | Resp 12

## 2022-03-16 DIAGNOSIS — Z982 Presence of cerebrospinal fluid drainage device: Secondary | ICD-10-CM | POA: Insufficient documentation

## 2022-03-16 DIAGNOSIS — G912 (Idiopathic) normal pressure hydrocephalus: Secondary | ICD-10-CM

## 2022-03-16 NOTE — Progress Notes (Signed)
[facility]  Follow up Note    Previous Impression/Plan        Referring MD: Judeth Cornfield, MD   Primary Care MD: Jeanann Lewandowsky, MD      MRN: [id number]     HPI   Chief Complaint:  NPH     HPI  [name] is a 77 y.o. 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 He is accompanied by his daughter. He recently moved to the area from [place].      He had initially VP shunt placement in 2018. He then developed worsening gait, and was found to have shunt block. He underwent revision in 2022 at [facility] clinic. He unfortunately suffered a stroke around that same time with right-sided weakness. He was doing well until COVID infection in 10/2020. He has had difficulty walking since then, but reports gradual improvement over the past 2 months. He can walk a few steps with assistance. He still has intermittent urinary incontinence. He denies memory difficulty. He lives in a nursing home and does PT 2-3 times a week. Dr. Dillard Cannon referred him to establish care with neurosurgery. He is scheduled for follow up in 09/2021 with updated imaging.         Radiology Interpretation   No results found.      The images above were personally reviewed and discussed in detail with the patient.      Impression   [name] is a 77 y/o male with hx NPH s/p VP shunt with Certas valve set to 5 and CVA with residual right upper extremity weakness who presents today to establish care after relocating from [place]. He is doing well overall with no new symptoms or concerns. He has difficulty walking which is improving with PT. I verified the shunt setting at 5. I recommend continued follow up with neurology. I answered all questions to the best of my ability. Patient and daughter voiced understanding.      Plan   1. Continue PT  2. Follow up with neurology Dr. Dillard Cannon as  scheduled  3. Follow up with neurosurgery as needed, patient knows to schedule shunt check if he has MRI at any time      Follow-up   No follow-ups on file.           HPI     Chief Complaint   Patient presents with    Other     Shunt check         [name] 77 y.o. 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 He presents today for a shunt check with his daughter after having a brain MRI.     Physical Examination   VITAL SIGNS:     Vitals:    03/16/22 1138   BP: 110/67   Pulse: 64   Resp: 12   SpO2: 96%          Neurologic Exam    Mental Status    Alert   Speech clear      Cranial Nerves      CN III, IV, VI   Pupils are equal, round, and reactive to light.  Extraocular motions are normal.      CN VII:  Facial expression full, symmetric.     CN VIII: Hearing intact b/l    CN XI: Shoulder shrug symmetric    Motor Exam   Overall muscle tone: normal     Strength   BUE/BLE 5/5     Gait: normal    Review of Systems   Review of Systems  Constitutional: negative for fever or chills.  HENT: Negative for tinnitus or rhinorrhea   Eyes: Negative for visual disturbance.   Musculoskeletal: Negative for gait problem. Negative for neck pain. Negative for back pain.   Skin: Negative for wounds.  Neurological: no history of seizures.  Respiratory: Negative for cough or wheezing  Endocrine: Negative for cold or heat intolerance   GI: Negative for constipation or diarrhea     Radiology Interpretation     Radiology Results (24 Hour)       ** No results found for the last 24 hours. **            The images above were personally reviewed and discussed in detail with the patient.       Impression   77 y.o. 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 The patient was also seen by Thedora Hinders, MD for the imaging interpretation, assessment and formulation of the medical decision making in the entirety. He also reviewed and updated the documented findings and plan of care.

## 2022-10-21 ENCOUNTER — Emergency Department: Payer: Medicare Other

## 2022-10-21 ENCOUNTER — Emergency Department
Admission: AC | Admit: 2022-10-21 | Discharge: 2022-10-21 | Disposition: A | Discharge status: Home / Self Care | Payer: Medicare Other | Attending: Emergency Medicine | Admitting: Emergency Medicine

## 2022-10-21 DIAGNOSIS — W19XXXA Unspecified fall, initial encounter: Secondary | ICD-10-CM

## 2022-10-21 DIAGNOSIS — S0101XA Laceration without foreign body of scalp, initial encounter: Secondary | ICD-10-CM

## 2022-10-21 DIAGNOSIS — Z982 Presence of cerebrospinal fluid drainage device: Secondary | ICD-10-CM

## 2022-10-21 DIAGNOSIS — S22010A Wedge compression fracture of first thoracic vertebra, initial encounter for closed fracture: Secondary | ICD-10-CM

## 2022-10-21 DIAGNOSIS — S22018A Other fracture of first thoracic vertebra, initial encounter for closed fracture: Secondary | ICD-10-CM | POA: Insufficient documentation

## 2022-10-21 DIAGNOSIS — Z23 Encounter for immunization: Secondary | ICD-10-CM | POA: Insufficient documentation

## 2022-10-21 LAB — CHEM 8 POCT
Anion Gap POCT: 19 meq/L — ABNORMAL HIGH (ref 5–15)
BUN POCT: 20 mg/dL (ref 8–26)
CO2 POCT: 25 meq/L (ref 23–33)
Calcium Ionized POCT: 2.3 meq/L — ABNORMAL LOW (ref 2.44–2.64)
Chloride POCT: 102 meq/L (ref 98–109)
Creatinine POCT: 1 mg/dL (ref 0.9–1.1)
Glucose POCT: 112 mg/dL — ABNORMAL HIGH (ref 70–100)
Hematocrit POCT: 40 % (ref 37.6–43.7)
Hemoglobin POCT: 13.6 g/dL — ABNORMAL LOW (ref 14.0–15.6)
Potassium POCT: 4 meq/L (ref 3.5–4.9)
Sodium POCT: 140 meq/L (ref 136–146)

## 2022-10-21 LAB — CBC
Absolute nRBC: 0 10*3/uL (ref <=0.00)
Hematocrit: 37.4 % — ABNORMAL LOW (ref 37.6–43.7)
Hemoglobin: 12.8 g/dL (ref 12.5–14.8)
MCH: 31.4 pg (ref 25.1–33.5)
MCHC: 34.2 g/dL (ref 31.5–35.8)
MCV: 91.9 fL (ref 78.0–96.0)
MPV: 9.9 fL (ref 8.9–12.5)
Platelet Count: 144 10*3/uL (ref 142–346)
RBC: 4.07 10*6/uL — ABNORMAL LOW (ref 4.20–5.10)
RDW: 13 % (ref 11–15)
WBC: 4.55 10*3/uL (ref 3.10–9.50)
nRBC %: 0 /100{WBCs} (ref <=0.0)

## 2022-10-21 LAB — BLOOD TYPE CONFIRMATION: Blood Type Confirmation: B POS

## 2022-10-21 LAB — ETHANOL (ALCOHOL) LEVEL: Alcohol: NOT DETECTED

## 2022-10-21 LAB — PT AND APTT
INR: 1.3 (ref 0.9–1.1)
PT: 14.2 s — ABNORMAL HIGH (ref 10.1–12.9)
PTT: 30 s (ref 27–39)

## 2022-10-21 LAB — TYPE AND SCREEN
ABO Rh: B POS
Antibody Screen: NEGATIVE

## 2022-10-21 MED ORDER — BACITRACIN +/- ZINC 500 UNIT/GM EX OINT (WRAP)
TOPICAL_OINTMENT | Freq: Once | CUTANEOUS | Status: AC
Start: 2022-10-21 — End: 2022-10-21
  Filled 2022-10-21: qty 1

## 2022-10-21 MED ORDER — TETANUS-DIPHTH-ACELL PERTUSSIS 5-2.5-18.5 LF-MCG/0.5 IM SUSP/SUSY (WR)
0.5000 mL | Freq: Once | INTRAMUSCULAR | Status: AC
Start: 2022-10-21 — End: 2022-10-21
  Administered 2022-10-21: 0.5 mL via INTRAMUSCULAR
  Filled 2022-10-21: qty 0.5

## 2022-10-21 NOTE — Discharge Instructions (Addendum)
YOU MAY SEE THE DOCTOR LISTED IF YOUR DOCTOR IS NOT AVAILABLE FOR FOLLOW UP IN THE TIME FRAME PROVIDED.  RETURN TO THE EMERGENCY DEPARTMENT IMMEDIATELY FOR ANY WORSENING OR CONCERNS.    After 24 hours, wash wound gently with mild soap and water so that no scab builds up and pat dry.  Apply over the counter Aquaphor ointment to wound.  Repeat this process daily for 14 days.  You do not have to keep wound covered after 24-48 hours.  If you have staples in your wound, have them removed by a doctor at the indicated time.  After wound edges are completely healed (approximately 14 days for most wounds) you may use sunscreen over the scar area once daily for 6 months to give the best cosmetic result to your scar.

## 2022-10-21 NOTE — ED Notes (Signed)
Security and Registration arrived.   Pt ID verified with matching armband

## 2022-10-21 NOTE — Consults (Signed)
[facility]                 TRAUMA CONSULT NOTE   Date Time: 10/21/22 7:33 PM  Patient Name: ZOXWRU,44 B  Attending Physician: Liliane Channel, MD  Type of Admission: Emergency    Thank you Liliane Channel, MD for consulting [place] on [phone] for:     REASON FOR CONSULTATION:   Trauma Modified Activation  CHIEF COMPLAINT:     Chief Complaint   Patient presents with    Fall    Trauma      ASSESSMENT/PLAN:   The patient has the following active problems:    Scalp laceration:  Repaired by ED provider with staples. Remove in 7-10 days.    Chronic anticoagulated:  - ok to resume anticoagulation    Ok to Luis Lopez from trauma standpoint. Follow up with PCP for staple removal.     HPI:   49 B Jaguar is a 77 y.o. who has  has no past medical history on file. who presented after  fall out of wheelchair  with scalp laceration. Patient with Parkinsons and VP shunt for NPH reports he was attempting to get out of his wheelchair when he lost his balance and fell. He was near a table at the time of the fall and reportedly something from the table then fell, striking him in the head. Denies LOC. Prior h/o CVA with residual R sided deficits. H/o afib. On xarelto.     The medications, past medical/surgical history, family history, allergies & full review of systems were:  Reviewed    Cervical spine clearance: Yes  Massive transfusion protocol:  No    Patient will be admitted to: [place] home  ALLERGIES:   Allergies[1]  MEDICATIONS:   (Not in a hospital admission)    PAST MEDICAL HISTORY:   No past medical history on file.  PAST SURGICAL HISTORY:   Past Surgical History[2]  FAMILY HISTORY:   Family History[3]  SOCIAL HISTORY:     Social History     Socioeconomic History    Marital status: Not on file     Spouse name: Not on file    Number of children: Not on file    Years of education: Not on file    Highest education level: Not on file   Occupational History    Not on file   Tobacco Use    Smoking status: Not  on file    Smokeless tobacco: Not on file   Substance and Sexual Activity    Alcohol use: Not on file    Drug use: Not on file    Sexual activity: Not on file   Other Topics Concern    Not on file   Social History Narrative    Not on file     Social Drivers of Health     Financial Resource Strain: Not on file   Food Insecurity: Not on file   Transportation Needs: Not on file   Physical Activity: Not on file   Stress: Not on file   Social Connections: Not on file   Intimate Partner Violence: Not on file   Housing Stability: Not on file     REVIEW OF SYSTEMS:   Pertinent positives as per HPI above  All other systems reviewed and are negative  PHYSICAL EXAM:     Vitals:    10/21/22 1858 10/21/22 1900 10/21/22 1905   BP: 162/72  152/61  Pulse: 71  68   Resp: 16  14   Temp: 97 F (36.1 C)     TempSrc: Temporal     SpO2: 99%  99%   Weight:  85.4 kg (188 lb 4.4 oz)    Height:  1.727 m (5\' 8" )      Patient Lines/Drains/Airways Status       Active PICC Line / CVC Line / PIV Line / Drain / Airway / Intraosseous Line / Epidural Line / ART Line / Line / Wound / Pressure Ulcer / NG/OG Tube       Name Placement date Placement time Site Days    Peripheral IV 10/21/22 22 G Standard Left Antecubital 10/21/22  1859  Antecubital  less than 1                  Constitutional: vital signs reviewed, appears comfortable and no acute distress  Eyes: pupils equal and reactive to light and extraocular eye movements intact  Head:  small laceration to right posterio head, unable to visualize, not actively bleeding  Anterior neck: no wound, contusions, or hematoma and trachea midline  Cervical spine: nontender and maintained in c-collar  Chest wall: chest wall non tender  Lungs: breath sounds clear and equal bilaterally  Heart: regular rhythm Pulses equal  Abdomen: soft, non-tender, and nondistended  Pelvis: stable  Extremities:  abrasion to RLE shin  Thoracic and lumbar spine: tenderness: absent  Neurologic: GCS: 15, motor and sensory exam:  normal  Fast Exam:  deferred    Intake and Output Summary (Last 24 hours) at Date Time    Intake/Output Summary (Last 24 hours) at 10/21/2022 1933  Last data filed at 10/21/2022 1857  Gross per 24 hour   Intake 0 ml   Output 0 ml   Net 0 ml     LABS:     Results       Procedure Component Value Units Date/Time    CBC without Differential [[id number]]  (Abnormal) Collected: 10/21/22 1909    Specimen: Blood, Venous Updated: 10/21/22 1924     WBC 4.55 x10 3/uL      Hemoglobin 12.8 g/dL      Hematocrit 27.2 %      Platelet Count 144 x10 3/uL      MPV 9.9 fL      RBC 4.07 x10 6/uL      MCV 91.9 fL      MCH 31.4 pg      MCHC 34.2 g/dL      RDW 13 %      nRBC % 0.0 /100 WBC      Absolute nRBC 0.00 x10 3/uL     Narrative:      Please note that the reference ranges provided with these results may not account for variations related to gender identity or hormonal therapy.   Individual clinical evaluation is needed to determine the impact of these reference ranges for this patient.    Chem 8 POCT [[id number]]  (Abnormal) Collected: 10/21/22 1910    Specimen: Blood, Venous Updated: 10/21/22 1918     Glucose POCT 112 mg/dL      BUN POCT 20 mg/dL      Creatinine POCT 1.0 mg/dL      Sodium POCT 140 mEq/L      Potassium POCT 4.0 mEq/L      Chloride POCT 102 mEq/L      CO2 POCT 25 mEq/L      Hematocrit POCT 40.0 %  Hemoglobin POCT 13.6 g/dL      Calcium Ionized POCT 2.30 mEq/L      Anion Gap POCT 19 mEq/L      GFR --    Narrative:      Please note that the reference ranges provided with these results may not account for variations related to gender identity or hormonal therapy.   Individual clinical evaluation is needed to determine the impact of these reference ranges for this patient.    Type and Screen [[id number]] Collected: 10/21/22 1909    Specimen: Blood, Venous Updated: 10/21/22 1916    ABO/Rh [[id number]] Collected: 10/21/22 1909    Specimen: Blood, Venous Updated: 10/21/22 1916    Rainbow Draw [[id number]] Collected: 10/21/22  1909    Specimen: Blood, Venous Updated: 10/21/22 1915    Narrative:      The following orders were created for panel order Rainbow Draw.  Procedure                               Abnormality         Status                     ---------                               -----------         ------                     Erin Sons Hold HQIO[[id number]]                                                          Light Green - LiHep PST .Marland Kitchen.[[id number]]                      In process                 Light Blue - Citrate Hol.Marland KitchenMarland Kitchen[[id number]]                                                 Lavender - EDTA Hold [id number]                                                     Please view results for these tests on the individual orders.    PT/APTT [[id number]] Collected: 10/21/22 1909    Specimen: Blood, Venous Updated: 10/21/22 1914    Sherlynn Stalls Tillman Sers PST Hold Tube [[id number]] Collected: 10/21/22 1909    Specimen: Blood, Venous Updated: 10/21/22 1914    Ethanol (Alcohol) Level [[id number]] Collected: 10/21/22 1909    Specimen: Blood, Venous Updated: 10/21/22 1914          RADS:   Radiological procedure personally reviewed:     XR Chest AP Portable    Result Date: 10/21/2022   No definite acute cardiopulmonary disease. Reuel Boom  Overdeck, MD 10/21/2022 7:19 PM   EKG Results       ** No results found for the last 48 hours. **            I have personally reviewed the patient's history and 24 hour interval events, along with vitals, labs, radiology images and nursing notes, as well as the medical record from the previous hospitalizations, and outside records available. So far today I have spent 53 minutes providing care for this patient excluding teaching and billable procedures, and not overlapping with any other providers with greater than 50% of the time in counseling or coordination of care. Marland Kitchen    SIGNED BY:     Rolla Flatten, FNP    *This note was generated by the Epic EMR system/ Dragon speech recognition and may contain inherent  errors or omissions not intended by the user. Grammatical errors, random word insertions, deletions, pronoun errors and incomplete sentences are occasional consequences of this technology due to software limitations. Not all errors are caught or corrected. If there are questions or concerns about the content of this note or information contained within the body of this dictation they should be addressed directly with the author for clarification          [1] No Known Allergies  [2] No past surgical history on file.  [3] No family history on file.

## 2022-10-22 LAB — LAB USE ONLY - LIGHT GREEN - LIHEP PST HOLD TUBE

## 2022-10-23 NOTE — ED Provider Notes (Signed)
History     Chief Complaint   Patient presents with    Fall    Trauma       Fall    Trauma  Mechanism of injury: Fall        Medical History[1]    Past Surgical History[2]    Family History[3]    Social  Social History[4]    .     Allergies[5]    Home Medications    None on File          Review of Systems  Pertinent positives:    Fall, scalp lac    Constitutional:  Denies fever or chills   Eyes:  Denies change in visual acuity or eye pain  HENT:  Denies sore throat, trouble swallowing   Respiratory:  Denies cough or shortness of breath or wheeze   Cardiovascular:  Denies chest pain or swelling   GI:  Denies abdominal pain, nausea, vomiting or diarrhea  GU:  Denies dysuria, hematuria, frequency or trouble urinating   Musculoskeletal:  Denies other joint pain  Integument:  Denies rash  Neurologic:  Denies headache, focal weakness, numbness, tingling, speech, vision or gait changes  Psychiatric:  Denies suicidal ideation.    Physical Exam    BP: 162/72, Heart Rate: 71, Temp: 97 F (36.1 C), Resp Rate: 16, SpO2: 99 %, Weight: 85.4 kg    Physical Exam    Pertinent Positives:    Lac to R parietal posterior area 3 cm  Gcs baseline  No spine tenderness    Constitutional:  Well developed, well nourished, no acute distress, not ill appearing   Eyes:   no discharge or redness  HENT:  Atraumatic, external ears normal, external nose/nares normal, oropharynx moist, no pharyngeal exudates. Neck- normal range of motion  Respiratory:  No respiratory distress, normal breath sounds, no rales, no wheezing, no rhonchi  Cardiovascular:  Normal rate, normal rhythm, no murmurs, no gallops, no rubs, normal radial pulses and dpp   GI:  Soft, nondistended, nontender, no rebound, no guarding    Musculoskeletal:  No edema, no tenderness, no deformities.   Back- no tenderness  Integument:  Well hydrated, no rash   Neurologic:  Alert & oriented x 3, normal motor function, no focal deficits noted, coordination normal   Psychiatric:  Speech  and behavior appropriate       Diagnosis management comments: I, Ferdinand Cava, MD, have been the primary provider for this patient during this Emergency Dept visit.    Oxygen saturation by pulse oximetry is greater than 94%, Normal, none needed.    When I was within 6 feet of this patient I donned the following PPE:  surgical mask y, Gloves Yes,     Chest xray visualized and interpreted independently by me as normal.    Shared decision making process utilized for patient orders including imaging, labs and medications.      Patient Progress  Patient progress: stable      MDM and ED Course     ED Medication Orders (From admission, onward)      Start Ordered     Status Ordering Provider    10/21/22 2033 10/21/22 2032  bacitracin ointment  Once        Route: Topical       Last MAR action: Given Liliane Channel    10/21/22 1904 10/21/22 1903  tetanus-diphth-acell pertussis (BOOSTRIX) injection 0.5 mL  Once        Route: Intramuscular  Ordered Dose: 0.5 mL       Last MAR action: Given Stamatia Masri J               Medical Decision Making  Pt was biba as modified trauma due to a fall on blood thinners.  Pt fell from wheelchair and then something scraped/hit his head causing a laceration.      No loss of consciousness, no alcohol or drug use, no  neck, back, chest, abdomen pain or injury.  No numbness, tingling or weakness, no speech, vision or new gait trouble    Ddx:  ich vs fracture vs other.      Ct, pain meds, lac repair, recheck.  Neuro at baseline after speaking with patients son.          Amount and/or Complexity of Data Reviewed  Labs: ordered. Decision-making details documented in ED Course.  Radiology: ordered and independent interpretation performed. Decision-making details documented in ED Course.    Risk  OTC drugs.  Prescription drug management.                     Lac Repair    Date/Time: 10/21/2022 8:48 PM    Performed by: Liliane Channel, MD  Authorized by: Liliane Channel, MD    Consent:      Consent obtained:  Verbal    Consent given by:  Patient    Risks, benefits, and alternatives were discussed: yes      Risks discussed:  Pain, infection, need for additional repair, poor cosmetic result, poor wound healing and retained foreign body    Alternatives discussed:  No treatment  Universal protocol:     Procedure explained and questions answered to patient or proxy's satisfaction: yes      Test results available: yes      Imaging studies available: yes      Patient identity confirmed:  Arm band  Anesthesia:     Anesthesia method:  Local infiltration    Local anesthetic:  Lidocaine 1% w/o epi  Laceration details:     Location:  Scalp    Scalp location:  R parietal    Length (cm):  3    Depth (mm):  5  Pre-procedure details:     Preparation:  Patient was prepped and draped in usual sterile fashion and imaging obtained to evaluate for foreign bodies  Exploration:     Hemostasis achieved with:  Direct pressure    Imaging outcome: foreign body not noted      Wound extent: no foreign bodies/material noted, no muscle damage noted and no underlying fracture noted    Treatment:     Area cleansed with:  Chlorhexidine    Amount of cleaning:  Standard    Irrigation solution:  Sterile saline    Irrigation volume:  300    Irrigation method:  Syringe    Visualized foreign bodies/material removed: no      Debridement:  None    Undermining:  None    Scar revision: no    Skin repair:     Repair method:  Staples    Number of staples:  3  Approximation:     Approximation:  Close  Repair type:     Repair type:  Simple  Post-procedure details:     Dressing:  Antibiotic ointment    Procedure completion:  Tolerated well, no immediate complications    Radiology Results (24 Hour)       Procedure Component  Value Units Date/Time    CT Cervical Spine WO Contrast [[id number]] Collected: 10/21/22 1934    Order Status: Completed Updated: 10/21/22 1938    Narrative:      HISTORY: Neck pain after trauma.    COMPARISON: None  available.    TECHNIQUE: CT of the cervical spine performed without intravenous contrast.  Multiplanar reformatted images were created and reviewed. The following  dose reduction techniques were utilized: automated exposure control and/or  adjustment of the mA and/or KV according to patient size, and the use of an  iterative reconstruction technique.    CONTRAST: None.    FINDINGS:   There is a normal cervical lordosis. The vertebral body heights are  maintained. There is a 2 mm grade 1 retrolisthesis of C4 on C5. There is  degenerative disc disease with mild to moderate multilevel loss of disc  height and endplate osteophytic spurring. No acute cervical spinal fracture  is seen. There is a chronic appearing compression fracture involving the  superior endplate of T1.      Impression:         1.No acute cervical spinal fracture identified. Degenerative changes of the  cervical spine.  2.Chronic appearing compression fracture involving the superior endplate of  T1.    Georgann Housekeeper, MD  10/21/2022 7:35 PM    CT Head WO Contrast [[id number]] Collected: 10/21/22 1931    Order Status: Completed Updated: 10/21/22 1936    Narrative:      HISTORY: Headache after trauma.     COMPARISON: None available.    TECHNIQUE: CT of the head performed without intravenous contrast. The  following dose reduction techniques were utilized: automated exposure  control and/or adjustment of the mA and/or KV according to patient size,  and the use of an iterative reconstruction technique.    CONTRAST: None.    FINDINGS:  There is a right frontal approach ventricular shunt catheter with its tip  within the right lateral ventricle abutting the septum pellucidum.    Hypoattenuating areas within the cerebral white matter are nonspecific but  likely reflect chronic small vessel ischemic change in a patient of this  age. Areas of encephalomalacia within the frontal lobes bilaterally may  reflect areas of chronic infarction. 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 There is no evidence of acute infarction or intracranial  hemorrhage. There is diffuse cerebral volume loss. 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 There is no mass  effect or midline shift. No extra-axial collections are seen. There are  bilateral lens implants. There is mild mucosal thickening within the  paranasal sinuses.      Impression:         1.No acute intracranial abnormality. Diffuse cerebral volume loss and  chronic ischemic changes as described.  2.Right frontal approach ventricular shunt catheter in place. 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    Georgann Housekeeper, MD  10/21/2022 7:34 PM    XR Chest AP Portable [[id number]] Collected: 10/21/22 1919    Order Status: Completed Updated: 10/21/22 1921    Narrative:      HISTORY: Trauma.     COMPARISON: None.    FINDINGS:   Tubing projects over the right neck and right chest likely a  ventriculoperitoneal shunt catheter. There is no definite acute  cardiopulmonary disease.      Impression:       No definite acute cardiopulmonary disease.    Rocky Crafts, MD  10/21/2022 7:19 PM            Clinical Impression & Disposition     Clinical Impression  Final diagnoses:   Fall, initial encounter   Scalp laceration, initial encounter   Compression fracture of T1 vertebra, initial encounter   History of brain shunt        ED Disposition       ED Disposition   Discharge    Condition   --    Date/Time   Fri Oct 21, 2022  8:33 PM    Comment   Rolen Ose discharge to home/self care.    Condition at disposition: Stable                  There are no discharge medications for this patient.                    [1] No past medical history on file.  [2] No past surgical history on file.  [3] No family history on  file.  [4]    [5] No Known Allergies       Liliane Channel, MD  10/23/22 1224

## 2022-10-27 ENCOUNTER — Emergency Department: Payer: Medicare Other

## 2022-10-27 ENCOUNTER — Emergency Department
Admission: AC | Admit: 2022-10-27 | Discharge: 2022-10-27 | Disposition: A | Discharge status: Home / Self Care | Payer: Medicare Other | Attending: Emergency Medicine | Admitting: Emergency Medicine

## 2022-10-27 DIAGNOSIS — W19XXXA Unspecified fall, initial encounter: Secondary | ICD-10-CM

## 2022-10-27 DIAGNOSIS — I288 Other diseases of pulmonary vessels: Secondary | ICD-10-CM

## 2022-10-27 DIAGNOSIS — S63232A Subluxation of proximal interphalangeal joint of right middle finger, initial encounter: Secondary | ICD-10-CM

## 2022-10-27 DIAGNOSIS — W050XXA Fall from non-moving wheelchair, initial encounter: Secondary | ICD-10-CM | POA: Insufficient documentation

## 2022-10-27 DIAGNOSIS — Z7901 Long term (current) use of anticoagulants: Secondary | ICD-10-CM | POA: Insufficient documentation

## 2022-10-27 DIAGNOSIS — Y905 Blood alcohol level of 100-119 mg/100 ml: Secondary | ICD-10-CM

## 2022-10-27 DIAGNOSIS — S62629A Displaced fracture of medial phalanx of unspecified finger, initial encounter for closed fracture: Secondary | ICD-10-CM

## 2022-10-27 DIAGNOSIS — I281 Aneurysm of pulmonary artery: Secondary | ICD-10-CM | POA: Insufficient documentation

## 2022-10-27 DIAGNOSIS — F10929 Alcohol use, unspecified with intoxication, unspecified: Secondary | ICD-10-CM

## 2022-10-27 DIAGNOSIS — M79645 Pain in left finger(s): Secondary | ICD-10-CM

## 2022-10-27 DIAGNOSIS — S62653A Nondisplaced fracture of medial phalanx of left middle finger, initial encounter for closed fracture: Secondary | ICD-10-CM | POA: Insufficient documentation

## 2022-10-27 DIAGNOSIS — R9341 Abnormal radiologic findings on diagnostic imaging of renal pelvis, ureter, or bladder: Secondary | ICD-10-CM | POA: Insufficient documentation

## 2022-10-27 HISTORY — DX: Cognitive communication deficit: R41.841

## 2022-10-27 HISTORY — DX: Major depressive disorder, recurrent, unspecified: F33.9

## 2022-10-27 HISTORY — DX: Parkinson's disease without dyskinesia, without mention of fluctuations: G20.A1

## 2022-10-27 HISTORY — DX: Hyperlipidemia, unspecified: E78.5

## 2022-10-27 HISTORY — DX: Weakness: R53.1

## 2022-10-27 LAB — URINALYSIS WITH REFLEX TO MICROSCOPIC EXAM - REFLEX TO CULTURE
Urine Bilirubin: NEGATIVE
Urine Blood: NEGATIVE
Urine Glucose: NEGATIVE
Urine Ketones: NEGATIVE mg/dL
Urine Nitrite: NEGATIVE
Urine Specific Gravity: 1.039 — ABNORMAL HIGH (ref 1.001–1.035)
Urine Urobilinogen: NORMAL mg/dL (ref 0.2–2.0)
Urine pH: 6.5 (ref 5.0–8.0)

## 2022-10-27 LAB — CBC
Absolute nRBC: 0 10*3/uL (ref <=0.00)
Hematocrit: 36.3 % — ABNORMAL LOW (ref 37.6–43.7)
Hemoglobin: 12.6 g/dL (ref 12.5–14.8)
MCH: 31.6 pg (ref 25.1–33.5)
MCHC: 34.7 g/dL (ref 31.5–35.8)
MCV: 91 fL (ref 78.0–96.0)
MPV: 9.8 fL (ref 8.9–12.5)
Platelet Count: 151 10*3/uL (ref 142–346)
RBC: 3.99 10*6/uL — ABNORMAL LOW (ref 4.20–5.10)
RDW: 12 % (ref 11–15)
WBC: 4.65 10*3/uL (ref 3.10–9.50)
nRBC %: 0 /100{WBCs} (ref <=0.0)

## 2022-10-27 LAB — URINE DRUGS OF ABUSE SCREEN
Urine Amphetamine Screen: NEGATIVE
Urine Barbituate Screen: NEGATIVE
Urine Benzodiazepine Screen: NEGATIVE
Urine Cannabinoid Screen: NEGATIVE
Urine Cocaine Screen: NEGATIVE
Urine Fentanyl Screen: NEGATIVE
Urine Opiate Screen: NEGATIVE
Urine PCP Screen: NEGATIVE

## 2022-10-27 LAB — CHEM 8 POCT
Anion Gap POCT: 21 meq/L — ABNORMAL HIGH (ref 5–15)
BUN POCT: 12 mg/dL (ref 8–26)
CO2 POCT: 20 meq/L — ABNORMAL LOW (ref 23–33)
Calcium Ionized POCT: 2.3 meq/L — ABNORMAL LOW (ref 2.44–2.64)
Chloride POCT: 104 meq/L (ref 98–109)
Creatinine POCT: 1.1 mg/dL (ref 0.9–1.1)
Glucose POCT: 71 mg/dL (ref 70–100)
Hematocrit POCT: 39 % (ref 37.6–43.7)
Hemoglobin POCT: 13.3 g/dL — ABNORMAL LOW (ref 14.0–15.6)
Potassium POCT: 3.8 meq/L (ref 3.5–4.9)
Sodium POCT: 141 meq/L (ref 136–146)

## 2022-10-27 LAB — PT/INR
INR: 1.1 (ref 0.9–1.1)
PT: 12.4 s (ref 10.1–12.9)

## 2022-10-27 LAB — TYPE AND SCREEN
ABO Rh: B POS
Antibody Screen: NEGATIVE

## 2022-10-27 LAB — BLOOD TYPE CONFIRMATION: Blood Type Confirmation: B POS

## 2022-10-27 LAB — APTT: PTT: 31 s (ref 27–39)

## 2022-10-27 LAB — ETHANOL (ALCOHOL) LEVEL: Alcohol: 109 mg/dL — ABNORMAL HIGH

## 2022-10-27 MED ORDER — IOHEXOL 350 MG/ML IV SOLN
100.0000 mL | Freq: Once | INTRAVENOUS | Status: AC | PRN
Start: 2022-10-27 — End: 2022-10-27
  Administered 2022-10-27: 100 mL via INTRAVENOUS

## 2022-10-27 NOTE — ED Provider Notes (Signed)
History     Chief Complaint   Patient presents with   . Fall   . 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 States mechanical fall he was trying to lean forward out of the chair and tipped forward. States he did have some wine to drink tonight. Denies LOC. Pt states he takes eliquis. Pt c/o of pain L  middle finger pain. Denies other complaints at this time. Reports had a fall last week has a healing wound of back of head. 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 Denies new weakness. Denies dizziness, HA, numbness, CP/rib pain, SOB, neck pain, back pain, abd pain, hip pain, other injury of extremities.    Medical History[1]    Past Surgical History[2]    Family History[3]    Social  Social History[4]    .     Allergies[5]    Home Medications       Med List Status: In Progress Set By: Fritz Pickerel, RN at 10/27/2022  6:50 PM              acetaminophen (TYLENOL) 325 MG tablet     Take 2 tablets (650 mg) by mouth 2 (two) times daily     amLODIPine (NORVASC) 10 MG tablet     Take 1 tablet (10 mg) by mouth daily     aspirin EC 81 MG EC tablet     Take 1 tablet (81 mg) by mouth daily     atorvastatin (LIPITOR) 40 MG tablet     Take 1 tablet (40 mg) by mouth daily     buPROPion SR (WELLBUTRIN SR) 150 MG 12 hr tablet     Take 1 tablet (150 mg) by mouth 2 (two) times daily     carbidopa-levodopa (SINEMET) 25-100 MG per tablet     Take 1 tablet by mouth 3 (three) times daily     carboxymethylcellulose (REFRESH TEARS) 0.5 % ophthalmic solution     Place 1 drop into both eyes 2 (two) times daily     cetirizine (ZyrTEC) 10 MG chewable tablet     Chew 1 tablet (10 mg) by mouth daily     citalopram (CeleXA) 20 MG tablet     Take 1 tablet (20 mg) by mouth daily     donepezil (ARICEPT) 10 MG tablet      Take 1 tablet (10 mg) by mouth nightly     ipratropium (ATROVENT) 0.06 % nasal spray     2 sprays by Nasal route 4 (four) times daily     loperamide (IMODIUM A-D) 2 MG tablet     Take 1 tablet (2 mg) by mouth 4 (four) times daily as needed for Diarrhea     Mometasone Furoate 50 MCG/ACT Aerosol     Inhale into the lungs     omeprazole (PriLOSEC) 20 MG capsule     Take 1 capsule (20 mg) by mouth daily     rivaroxaban (XARELTO) 10 MG Tab     Take by mouth daily with dinner     vitamin B-12 (CYANOCOBALAMIN) 500 MCG tablet     Take 1 tablet (500 mcg) by mouth daily  Review of Systems   All other systems reviewed and are negative.      Physical Exam    BP: 102/62, Heart Rate: 65, Temp: 97.1 F (36.2 C), Resp Rate: 18, SpO2: 96 %, Weight: 81.6 kg    Physical Exam  Vitals and nursing note reviewed.   Constitutional:       General: [name] is not in acute distress.     Appearance: [name] is not ill-appearing, toxic-appearing or diaphoretic.   HENT:      Head: Normocephalic.      Comments: Healing wound of posterior scalp no erythema or discharge, no other deformity or injury appreciated     Right Ear: External ear normal.      Left Ear: External ear normal.      Nose: Nose normal.      Mouth/Throat:      Mouth: Mucous membranes are moist.      Pharynx: Oropharynx is clear.   Eyes:      Extraocular Movements: Extraocular movements intact.      Conjunctiva/sclera: Conjunctivae normal.      Pupils: Pupils are equal, round, and reactive to light.   Cardiovascular:      Rate and Rhythm: Normal rate and regular rhythm.      Heart sounds: Normal heart sounds.   Pulmonary:      Effort: Pulmonary effort is normal. No respiratory distress.      Breath sounds: Normal breath sounds.   Chest:      Chest wall: No tenderness.   Abdominal:      General: There is no distension.      Palpations: Abdomen is soft.      Tenderness: There is no abdominal tenderness. There is no guarding.   Musculoskeletal:      Comments: No C/ No T/ No  L spine tenderness    No abrasions    LUE:  TTP and bruising of L middle finger. No other tenderness throughout.  No deformity.  No effusion.  No crepitus. Full range of motion of middle finger and hand and otherwise throughout LUE with ability to flex and extend to 180 degrees independently. Intact pincer grasp. Compartments soft.  Intact motor and distal sensation.  RUE:  No tenderness throughout.  No deformity.  No effusion.  No crepitus. Full range of motion with ability to extend to 180 degrees independently.  Compartments soft.  Intact motor and distal sensation.  LLE:  No tenderness throughout.  No deformity.  No effusion.  No crepitus. Full range of motion with ability to extend to 180 degrees independently.  Compartments soft.  Intact motor and distal sensation.  RLE:  No tenderness throughout.  No deformity.  No effusion.  No crepitus. Full range of motion with ability to extend to 180 degrees independently.  Compartments soft.  Intact motor and distal sensation.    2+ Radial and PT pulses bilaterally     Skin:     General: Skin is warm and dry.      Capillary Refill: Capillary refill takes less than 2 seconds.      Coloration: Skin is not pale.      Findings: No rash.   Neurological:      Mental Status: Catherine is alert and oriented to person, place, and time.      Cranial Nerves: No cranial nerve deficit.      Sensory: No sensory deficit.      Comments: 4/5 strength R side (per pt this is baseline  from old stroke)  No other focal deficits   Psychiatric:         Behavior: Behavior normal.           MDM and ED Course     ED Medication Orders (From admission, onward)      Start Ordered     Status Ordering Provider    10/27/22 1837 10/27/22 1838  iohexol (OMNIPAQUE) 350 MG/ML injection 100 mL  IMG once as needed        Route: Intravenous  Ordered Dose: 100 mL       Last MAR action: Imaging Agent Given SUNG, JOSHUA K               Medical Decision Making      Differential diagnosis and management:     Patient  evaluated in trauma bay as trauma activation per facility protocol in conjunction with trauma team at bedside.  Patient with stable vital signs, neurovascularly intact. R sided weakness reported to be old per pt doubt acute CVA. Screening labs ordered per facility trauma protocol. Will rule out significant traumatic injuries including intracranial hemorrhage, spinal fracture, rib fracture, pneumothorax, pelvic fracture, solid organ damage or other thoracoabdominal injury.      Chronic illness impacting care and increasing risk of acute/chronic problems (obesity based on bmi >30, diabetes, hypertension, elderly - 60 or older): age > 78, h/o anticoagulation    ______________________________________________________________________  Amount/complexity of Data Reviewed   L\  Look below for information    History obtained from another historian -see HPI or here (parent, spouse,  care giver, ems) : EMS report VSS enroute          Independent visualized and interpretation of radiological study by me:     Type of radiological study performed : CXR  Independent Interpretation by me: I personally reviewed and interpreted these images. Vascular congestions. No infiltrate, pneumothorax.  No acute mediastinal process.       Discussion of management with other physicians, NP, APP and/or other caretakers:   Discussion with  trauma team APP Silvestre Mesi and recommendation(s) were for workup including CT imaging as noted. Patient condition and all pertinent labs and/or radiology studies discussed.                                  ED Course as of 10/27/22 2200   Thu Oct 27, 2022   1923 CT Head WO Contrast [JS]   1923 CT Cervical Spine WO Contrast [JS]   1923 CT Reconstruction L-spine [JS]   1923 CT Reconstruction T-spine  No complaints of back pain, compression fx unlikely to be acute [JS]   1924 XR Hand Left PA And Lateral  Will obtain wrist XR [JS]   2019 XR Hand Left PA And Lateral  D/w reading radiologist [JS]   2019 Wrist Left PA  Lateral and Oblique [JS]   2143 CT Angiogram Chest [JS]   2143 CT Abdomen Pelvis W IV/ WO PO Cont [JS]   2144 Patient with incidental finding of pulmonary artery dilation.  I discussed this finding directly with patient and daughter at bedside.  Discussed follow-up with PCP, cardiology. Emphasized importance of follow-up and that failure to do so could lead to serious and possibly life-threatening complications. Patient and daughter verbalized understanding, all questions answered.   [JS]   2145 Also discussed bladder findings, pt declined to provide UA at this time he is  asymptomatic denies fever, flank pain, abd pain, dysuria, hematuria. Daughter at bedside for discussion. States she will call [facility] for facility APP provider to see patient tomorrow and f/u for UA [JS]   2146 Otherwise plan for [facility], given resource for f/u and return precautions [JS]   2147 Finger splint applied for fx [JS]   2148 Patient resting comfortably and feels well to go home.  Discussed findings, plan, and follow-up with patient and daughter.  Discussed return precautions. They verbalized understanding and all questions answered to patient's satisfaction.     [JS]   2158 Pt gave UA sample but pt and daughter declined to wait for results. Daughter states she has mychart for pt and will discuss results with pt's provider tomorrow. [JS]      ED Course User Index  [JS] Samanvi Cuccia, Carlisle Beers, MD             Procedures    Clinical Impression & Disposition     Clinical Impression  Final diagnoses:   Fall, initial encounter   Chronic anticoagulation   Pain of left middle finger   Abnormal CT scan, bladder   Fracture of middle phalanx of 3rd finger of left hand   Dilatation of pulmonic artery        ED Disposition       ED Disposition   Discharge    Condition   --    Date/Time   Thu Oct 27, 2022  9:21 PM    Comment   Deovion Pioli Satterwhite discharge to home/self care.    Condition at disposition: Stable                  New Prescriptions     No medications on file             Loneta Tamplin, Carlisle Beers, MD  10/27/22 2149         [1]   Past Medical History:  Diagnosis Date   . Aphasia    . Apraxia    . Atrial fibrillation    . Cognitive communication deficit    . Hemiplegia and hemiparesis following cerebral infarction affecting right dominant side    . Hyperlipemia    . Major depression, recurrent    . Parkinsons    . Weakness    [2] No past surgical history on file.  [3] No family history on file.  [4]    [5]   Allergies  Allergen Reactions   . Ace Inhibitors

## 2022-10-27 NOTE — ED Notes (Signed)
Security and registration at bedside. ID verified.

## 2022-10-28 ENCOUNTER — Encounter: Payer: Self-pay | Admitting: Nurse Practitioner

## 2022-10-28 LAB — LAB USE ONLY - LIGHT GREEN - LIHEP PST HOLD TUBE

## 2022-10-28 LAB — LAB USE ONLY - URINE GRAY CULTURE HOLD TUBE

## 2022-10-28 NOTE — Consults (Signed)
[facility]                 TRAUMA CONSULT NOTE   Date Time: 10/28/22 12:07 AM  Patient Name: [name]  Attending Physician: No att. providers found  Type of Admission: Emergency    Thank you No att. providers found for consulting [place] on Weppler,Noal M for:     REASON FOR CONSULTATION:   Trauma Modified Activation  CHIEF COMPLAINT:     Chief Complaint   Patient presents with    Fall    Trauma      ASSESSMENT/PLAN:   The patient has the following active problems:  Problem List[1]    Patient has BMI=Body mass index is 27.35 kg/m.  Diagnosis: No additional diagnosis based on BMI criteria         Fall out of wheelchair  ETOH intoxication  - Trauma work up is negative for acute findings  - Left hand xray: a mild dorsal subluxation of the third middle phalanx. There is a slight contour irregularity at the volar aspect of the base of the third middle phalanx which is at least partly projectional although a coexisting fracture is not excluded.    Otherwise non tender throughout the hand  - Patient may be discharged back to his nursing home from trauma standpoint    HPI:   [name] is a 77 y.o. who has  has a past medical history of Aphasia, Apraxia, Atrial fibrillation, Cognitive communication deficit, Hemiplegia and hemiparesis following cerebral infarction affecting right dominant side, Hyperlipemia, Major depression, recurrent, Parkinsons, and Weakness. who presented after GLF while sitting on a wheelchair.  It was unwitnessed fall.  He was found on a prone position by the staff with down time of maybe 15 minutes.  He has a few alcoholic beverages this afternoon.  He is on Xarelto.  He only complains of left middle finger pain.    GCS 15  Airway is intact  Breath sounds present and equal bilaterally  Circulation is normal  Disability - None   Expose down to his underwear  .     The medications, past medical/surgical history, family history, allergies & full review of  systems were:  Reviewed    Cervical spine clearance: Yes  Massive transfusion protocol:  No      TRAUMA TERTIARY SURVEY:   [facility] Physical Exam  Physical Tertiary Exam Date: 10/28/2022, 12:07 AM  Provider: Sharene Skeans, NP   Imaging Reviewed: Yes  Plan Of Care/ Consults Reviewed: Yes  Additional Imaging Ordered (TTS): None      Screening / Interventions  Lab Results   Component Value Date/Time    ALCOHOL 109 (H) 10/27/2022 06:26 PM      Lab Results   Component Value Date/Time    AMPHETAMINUR Negative 10/27/2022 09:59 PM    BARBITURATEU Negative 10/27/2022 09:59 PM    BENZODIAZEUR Negative 10/27/2022 09:59 PM    CANNABINOIDU Negative 10/27/2022 09:59 PM    COCAINEUR Negative 10/27/2022 09:59 PM    URFENTANYL Negative 10/27/2022 09:59 PM    OPIATEUR Negative 10/27/2022 09:59 PM    PCPUR Negative 10/27/2022 09:59 PM        ALLERGIES:   Allergies[2]  MEDICATIONS:   (Not in a hospital admission)    No current facility-administered medications on file prior to encounter.     Current Outpatient Medications on File Prior to Encounter   Medication Sig Dispense Refill    acetaminophen (TYLENOL) 325  MG tablet Take 2 tablets (650 mg) by mouth 2 (two) times daily      amLODIPine (NORVASC) 10 MG tablet Take 1 tablet (10 mg) by mouth daily      aspirin EC 81 MG EC tablet Take 1 tablet (81 mg) by mouth daily      atorvastatin (LIPITOR) 40 MG tablet Take 1 tablet (40 mg) by mouth daily      buPROPion SR (WELLBUTRIN SR) 150 MG 12 hr tablet Take 1 tablet (150 mg) by mouth 2 (two) times daily      carbidopa-levodopa (SINEMET) 25-100 MG per tablet Take 1 tablet by mouth 3 (three) times daily      carboxymethylcellulose (REFRESH TEARS) 0.5 % ophthalmic solution Place 1 drop into both eyes 2 (two) times daily      cetirizine (ZyrTEC) 10 MG chewable tablet Chew 1 tablet (10 mg) by mouth daily      citalopram (CeleXA) 20 MG tablet Take 1 tablet (20 mg) by mouth daily      donepezil (ARICEPT) 10 MG tablet Take 1 tablet (10 mg) by mouth  nightly      ipratropium (ATROVENT) 0.06 % nasal spray 2 sprays by Nasal route 4 (four) times daily      loperamide (IMODIUM A-D) 2 MG tablet Take 1 tablet (2 mg) by mouth 4 (four) times daily as needed for Diarrhea      Mometasone Furoate 50 MCG/ACT Aerosol Inhale into the lungs      omeprazole (PriLOSEC) 20 MG capsule Take 1 capsule (20 mg) by mouth daily      rivaroxaban (XARELTO) 10 MG Tab Take by mouth daily with dinner      vitamin B-12 (CYANOCOBALAMIN) 500 MCG tablet Take 1 tablet (500 mcg) by mouth daily         PAST MEDICAL HISTORY:     Past Medical History:   Diagnosis Date    Aphasia     Apraxia     Atrial fibrillation     Cognitive communication deficit     Hemiplegia and hemiparesis following cerebral infarction affecting right dominant side     Hyperlipemia     Major depression, recurrent     Parkinsons     Weakness      PAST SURGICAL HISTORY:   Past Surgical History[3]  FAMILY HISTORY:   Family History[4]  SOCIAL HISTORY:     Social History     Socioeconomic History    Marital status: Unknown     Spouse name: Not on file    Number of children: Not on file    Years of education: Not on file    Highest education level: Not on file   Occupational History    Not on file   Tobacco Use    Smoking status: Not on file    Smokeless tobacco: Not on file   Substance and Sexual Activity    Alcohol use: Not on file    Drug use: Not on file    Sexual activity: Not on file   Other Topics Concern    Not on file   Social History Narrative    Not on file     Social Drivers of Health     Financial Resource Strain: Not on file   Food Insecurity: No Food Insecurity (10/27/2022)    Hunger Vital Sign     Worried About Running Out of Food in the Last Year: Never true     Ran Out of Food in  the Last Year: Never true   Transportation Needs: No Transportation Needs (10/27/2022)    PRAPARE - Therapist, art (Medical): No     Lack of Transportation (Non-Medical): No   Physical Activity: Not on file    Stress: Not on file   Social Connections: Not on file   Intimate Partner Violence: Not At Risk (10/27/2022)    Humiliation, Afraid, Rape, and Kick questionnaire     Fear of Current or Ex-Partner: No     Emotionally Abused: No     Physically Abused: No     Sexually Abused: No   Housing Stability: Low Risk  (10/27/2022)    Housing Stability Vital Sign     Unable to Pay for Housing in the Last Year: No     Number of Times Moved in the Last Year: 0     Homeless in the Last Year: No     REVIEW OF SYSTEMS:   Pertinent positives as per HPI above  All other systems reviewed and are negative  PHYSICAL EXAM:     Vitals:    10/27/22 1830 10/27/22 1834 10/27/22 1925 10/27/22 2218   BP: 132/87  149/65 141/67   Pulse: 64  63 62   Resp:   17 17   Temp:       TempSrc:       SpO2: 95%  99% 96%   Weight:  81.6 kg (179 lb 14.3 oz)     Height:  1.727 m (5\' 8" )       Patient Lines/Drains/Airways Status       Active PICC Line / CVC Line / PIV Line / Drain / Airway / Intraosseous Line / Epidural Line / ART Line / Line / Wound / Pressure Ulcer / NG/OG Tube       Name Placement date Placement time Site Days    Peripheral IV 10/27/22 20 G Standard Left Antecubital 10/27/22  1824  Antecubital  less than 1    Peripheral IV 10/27/22 20 G Diffusion Right Antecubital 10/27/22  1831  Antecubital  less than 1                  Constitutional: vital signs reviewed, appears comfortable and no acute distress  Eyes: pupils equal and reactive to light and extraocular eye movements intact  Head: atraumatic, facial bones nontender, no blood in the ears, and no blood in the nose, mouth  Anterior neck: no wound, contusions, or hematoma and trachea midline  Cervical spine: nontender and maintained in c-collar  Chest wall: chest wall non tender, no open wounds, no deformities, and no contusions  Lungs: breath sounds clear and equal bilaterally  Heart: regular rhythm Pulses equal  Abdomen: soft, non-tender, and nondistended  Pelvis: stable and  nontender  Extremities: no wounds, no deformities, and contusion noted dorsal surface of left middle phalanx.    Thoracic and lumbar spine: tenderness: absent  Neurologic: GCS: 15, motor and sensory exam: 5/5 on left, 4/5 on right which is at his baseline       Intake and Output Summary (Last 24 hours) at Date Time    Intake/Output Summary (Last 24 hours) at 10/28/2022 0007  Last data filed at 10/27/2022 1831  Gross per 24 hour   Intake 0 ml   Output 0 ml   Net 0 ml     LABS:   ETOH: Yes  Results       Procedure Component Value Units Date/Time  Urinalysis with Reflex to Microscopic Exam and Culture [[id number]]  (Abnormal) Collected: 10/27/22 2159    Specimen: Urine, Clean Catch Updated: 10/27/22 2218     Urine Color Colorless     Urine Clarity Clear     Urine Specific Gravity 1.039     Urine pH 6.5     Urine Leukocyte Esterase Large     Urine Nitrite Negative     Urine Protein 10= Trace     Urine Glucose Negative     Urine Ketones Negative mg/dL      Urine Urobilinogen Normal mg/dL      Urine Bilirubin Negative     Urine Blood Negative     RBC, UA 0-2 /hpf      Urine WBC 26-50 /hpf      Urine Mucus Present    Culture, Urine [[id number]] Collected: 10/27/22 2159    Specimen: Urine, Clean Catch Updated: 10/27/22 2218    Urine Gray Culture Hold Tube [[id number]] Collected: 10/27/22 2159    Specimen: Urine, Clean Catch Updated: 10/27/22 2204    Blood Type Confirmation [[id number]] Collected: 10/27/22 1930    Specimen: Blood, Venous Updated: 10/27/22 2008     Blood Type Confirmation B POS    Type and Screen [[id number]] Collected: 10/27/22 1905    Specimen: Blood, Venous Updated: 10/27/22 1956     ABO Rh B Pos     Antibody Screen Negative     Type And Screen Expiration 10/30/2022 23:59    Ethanol (Alcohol) Level [[id number]]  (Abnormal) Collected: 10/27/22 1826    Specimen: Blood, Venous Updated: 10/27/22 1847     Alcohol 109 mg/dL     PT/INR [[id number]]  (Normal) Collected: 10/27/22 1826    Specimen: Blood, Venous Updated:  10/27/22 1845     PT 12.4 sec      INR 1.1    APTT [[id number]]  (Normal) Collected: 10/27/22 1826    Specimen: Blood, Venous Updated: 10/27/22 1845     PTT 31 sec     Chem 8 POCT [[id number]]  (Abnormal) Collected: 10/27/22 1829    Specimen: Blood, Venous Updated: 10/27/22 1843     Glucose POCT 71 mg/dL      BUN POCT 12 mg/dL      Creatinine POCT 1.1 mg/dL      Sodium POCT 141 mEq/L      Potassium POCT 3.8 mEq/L      Chloride POCT 104 mEq/L      CO2 POCT 20 mEq/L      Hematocrit POCT 39.0 %      Hemoglobin POCT 13.3 g/dL      Calcium Ionized POCT 2.30 mEq/L      Anion Gap POCT 21 mEq/L      GFR --    Narrative:      Please note that the reference ranges provided with these results may not account for variations related to gender identity or hormonal therapy.   Individual clinical evaluation is needed to determine the impact of these reference ranges for this patient.    CBC without Differential [[id number]]  (Abnormal) Collected: 10/27/22 1826    Specimen: Blood, Venous Updated: 10/27/22 1836     WBC 4.65 x10 3/uL      Hemoglobin 12.6 g/dL      Hematocrit 42.7 %      Platelet Count 151 x10 3/uL      MPV 9.8 fL      RBC 3.99 x10 6/uL      MCV 91.0 fL  MCH 31.6 pg      MCHC 34.7 g/dL      RDW 12 %      nRBC % 0.0 /100 WBC      Absolute nRBC 0.00 x10 3/uL     Narrative:      Please note that the reference ranges provided with these results may not account for variations related to gender identity or hormonal therapy.   Individual clinical evaluation is needed to determine the impact of these reference ranges for this patient.    Rainbow Draw [[id number]] Collected: 10/27/22 1826    Specimen: Blood, Venous Updated: 10/27/22 1831    Narrative:      The following orders were created for panel order Rainbow Draw.  Procedure                               Abnormality         Status                     ---------                               -----------         ------                     Burna Mortimer - LiHep PST  .Marland Kitchen.[[id number]]                      In process                   Please view results for these tests on the individual orders.    Rhea Bleacher PST Hold Tube [[id number]] Collected: 10/27/22 1826    Specimen: Blood, Venous Updated: 10/27/22 1831          RADS:   Radiological procedure personally reviewed:     XR Hand Left PA And Lateral    Addendum Date: 10/27/2022    There is a mild dorsal subluxation of the third middle phalanx. There is a slight contour irregularity at the volar aspect of the base of the third middle phalanx which is at least partly projectional although a coexisting fracture is not excluded. IMPRESSION (addended): 1. Possible fracture of the base of the third middle phalanx. Mild dorsal subluxation of the third middle phalanx at the PIP joint. Addendum discussed by Gustavus Messing MD with Mar Daring MD at 10/27/2022 8:01 PM via phone.  Gustavus Messing, MD 10/27/2022 8:02 PM    Result Date: 10/27/2022   Apparent cortical step-off of the scaphoid is favored to be projectional although an acute fracture cannot be entirely excluded. Recommend dedicated radiographs of the left wrist. No dislocation. No evidence of acute fracture elsewhere. Advanced osteopenia Radiocarpal degenerative changes. Gustavus Messing, MD 10/27/2022 6:53 PM    Wrist Left PA Lateral and Oblique    Result Date: 10/27/2022  1.No acute fracture or dislocation within the wrist. 2.Radiocarpal degenerative changes. 3.Diffuse osteopenia. 4.Other chronic findings as above. Gustavus Messing, MD 10/27/2022 7:52 PM    CT Abdomen Pelvis W IV/ WO PO Cont    Result Date: 10/27/2022  1.No acute traumatic finding is seen within the chest, abdomen or pelvis. 2.There are findings suggestive of cystitis. Correlate with urinalysis. 3.Dilated main pulmonary artery which can be seen with pulmonary arterial hypertension. Daryll Brod  Brett Canales, MD 10/27/2022 7:20 PM    CT Angiogram Chest    Result Date: 10/27/2022  1.No acute traumatic finding is seen within the chest,  abdomen or pelvis. 2.There are findings suggestive of cystitis. Correlate with urinalysis. 3.Dilated main pulmonary artery which can be seen with pulmonary arterial hypertension. Karilyn Cota, MD 10/27/2022 7:20 PM    CT Reconstruction T-spine    Result Date: 10/27/2022  1.3.Diffuse osseous demineralization without definitive evidence of acute fracture within the thoracic or lumbar spine. 2.Subacute or chronic appearing 50% superior endplate compression fracture of T5 without retropulsion. 3.Chronic compression fractures of T1, L1, and L2. Garnette Gunner, MD 10/27/2022 7:16 PM    CT Reconstruction L-spine    Result Date: 10/27/2022  1.3.Diffuse osseous demineralization without definitive evidence of acute fracture within the thoracic or lumbar spine. 2.Subacute or chronic appearing 50% superior endplate compression fracture of T5 without retropulsion. 3.Chronic compression fractures of T1, L1, and L2. Garnette Gunner, MD 10/27/2022 7:16 PM    CT Cervical Spine WO Contrast    Result Date: 10/27/2022  1.No evidence of acute fracture or traumatic misalignment in the cervical spine. 2.Mild multilevel degenerative changes. Garnette Gunner, MD 10/27/2022 7:10 PM    CT Head WO Contrast    Result Date: 10/27/2022   1.No CT evidence of acute territorial infarction or hemorrhage. 2.Right frontal approach ventriculostomy catheter appears in place. Moderate ventriculomegaly is present and favored to be chronic; please correlate with any available prior studies. 3.Chronic ischemic changes, as above. Garnette Gunner, MD 10/27/2022 7:06 PM    XR Chest AP Portable    Result Date: 10/27/2022  1. Pulmonary vascular congestion J. Carole Binning, MD 10/27/2022 6:38 PM   EKG Results       ** No results found for the last 48 hours. **            I have personally reviewed the patient's history and 24 hour interval events, along with vitals, labs, radiology images and nursing notes, as well as the medical record from the previous hospitalizations, and  outside records available. So far today I have spent 50 minutes providing care for this patient excluding teaching and billable procedures, and not overlapping with any other providers with greater than 50% of the time in counseling or coordination of care. Marland Kitchen    SIGNED BY:     Sharene Skeans, ACNP-BC  Nurse Practitioner  Trauma/Critical Care  [facility]  [phone]      *This note was generated by the Epic EMR system/ Dragon speech recognition and may contain inherent errors or omissions not intended by the user. Grammatical errors, random word insertions, deletions, pronoun errors and incomplete sentences are occasional consequences of this technology due to software limitations. Not all errors are caught or corrected. If there are questions or concerns about the content of this note or information contained within the body of this dictation they should be addressed directly with the author for clarification        [1] There is no problem list on file for this patient.   [2]   Allergies  Allergen Reactions    Ace Inhibitors    [3] No past surgical history on file.  [4] No family history on file.

## 2022-10-29 NOTE — Progress Notes (Signed)
Ucx low quantity of nonspecific bacteria, no need for abx, . Note sent.       Your urine culture is low quantity of nonspecific bacteria, recommend no antibiotics.     Recommend to repeat the urine with your doctor if you have urinary symptoms.     If you have any questions recommend to contact your primary care doctor and /or [facility] at [phone] . Please contact your primary care doctor within a week. If worsening signs and symptoms return to ED    Please do not respond to this email.     Cathlean Marseilles, MD  [facility]  [facility].

## 2023-01-13 ENCOUNTER — Emergency Department: Payer: Medicare Other

## 2023-01-13 ENCOUNTER — Emergency Department
Admission: AC | Admit: 2023-01-13 | Discharge: 2023-01-13 | Disposition: A | Discharge status: Home / Self Care | Payer: Medicare Other | Attending: Emergency Medicine | Admitting: Emergency Medicine

## 2023-01-13 DIAGNOSIS — Z982 Presence of cerebrospinal fluid drainage device: Secondary | ICD-10-CM | POA: Insufficient documentation

## 2023-01-13 DIAGNOSIS — M549 Dorsalgia, unspecified: Secondary | ICD-10-CM

## 2023-01-13 DIAGNOSIS — Z7901 Long term (current) use of anticoagulants: Secondary | ICD-10-CM | POA: Insufficient documentation

## 2023-01-13 DIAGNOSIS — W050XXA Fall from non-moving wheelchair, initial encounter: Secondary | ICD-10-CM | POA: Insufficient documentation

## 2023-01-13 DIAGNOSIS — S80211A Abrasion, right knee, initial encounter: Secondary | ICD-10-CM | POA: Insufficient documentation

## 2023-01-13 DIAGNOSIS — W19XXXA Unspecified fall, initial encounter: Secondary | ICD-10-CM

## 2023-01-13 LAB — PT/INR
INR: 1.1 (ref 0.9–1.1)
PT: 12.8 s (ref 10.1–12.9)

## 2023-01-13 LAB — CBC
Absolute nRBC: 0 10*3/uL (ref <=0.00)
Hematocrit: 36.9 % — ABNORMAL LOW (ref 37.6–49.6)
Hemoglobin: 12.8 g/dL (ref 12.5–17.1)
MCH: 31.8 pg (ref 25.1–33.5)
MCHC: 34.7 g/dL (ref 31.5–35.8)
MCV: 91.8 fL (ref 78.0–96.0)
MPV: 9.8 fL (ref 8.9–12.5)
Platelet Count: 133 10*3/uL — ABNORMAL LOW (ref 142–346)
RBC: 4.02 10*6/uL — ABNORMAL LOW (ref 4.20–5.90)
RDW: 13 % (ref 11–15)
WBC: 4.96 10*3/uL (ref 3.10–9.50)
nRBC %: 0 /100{WBCs} (ref <=0.0)

## 2023-01-13 LAB — TYPE AND SCREEN
ABO Rh: B POS
Antibody Screen: NEGATIVE

## 2023-01-13 LAB — BASIC METABOLIC PANEL
Anion Gap: 9 (ref 5.0–15.0)
BUN: 23 mg/dL (ref 9–28)
CO2: 23 meq/L (ref 17–29)
Calcium: 8.6 mg/dL (ref 7.9–10.2)
Chloride: 108 meq/L (ref 99–111)
Creatinine: 1 mg/dL (ref 0.5–1.5)
GFR: >60 mL/min/{1.73_m2} (ref >=60.0)
Glucose: 87 mg/dL (ref 70–100)
Potassium: 4 meq/L (ref 3.5–5.3)
Sodium: 140 meq/L (ref 135–145)

## 2023-01-13 LAB — URINE DRUGS OF ABUSE SCREEN
Urine Amphetamine Screen: NEGATIVE
Urine Barbituate Screen: NEGATIVE
Urine Benzodiazepine Screen: NEGATIVE
Urine Cannabinoid Screen: NEGATIVE
Urine Cocaine Screen: NEGATIVE
Urine Fentanyl Screen: POSITIVE — AB
Urine Opiate Screen: NEGATIVE
Urine PCP Screen: NEGATIVE

## 2023-01-13 LAB — ETHANOL (ALCOHOL) LEVEL: Alcohol: NOT DETECTED

## 2023-01-13 LAB — URINALYSIS WITH REFLEX TO MICROSCOPIC EXAM - REFLEX TO CULTURE
Urine Bilirubin: NEGATIVE
Urine Blood: NEGATIVE
Urine Glucose: NEGATIVE
Urine Ketones: NEGATIVE mg/dL
Urine Nitrite: POSITIVE — AB
Urine Specific Gravity: 1.023 (ref 1.001–1.035)
Urine Urobilinogen: NORMAL mg/dL (ref 0.2–2.0)
Urine pH: 6.5 (ref 5.0–8.0)

## 2023-01-13 LAB — APTT: PTT: 35 s (ref 27–39)

## 2023-01-13 MED ORDER — CALCIUM CITRATE-VITAMIN D 315-6.25 MG-MCG PO TABS
1.0000 | ORAL_TABLET | Freq: Two times a day (BID) | ORAL | Status: DC
Start: 2023-01-13 — End: 2023-01-13

## 2023-01-13 NOTE — ED Notes (Signed)
 Security and registration at bedside. ID placed on right wrist

## 2023-01-13 NOTE — ED Provider Notes (Signed)
 [facility] EMERGENCY DEPARTMENT  ATTENDING PHYSICIAN HISTORY AND PHYSICAL EXAM     Patient Name: [name], [name]  Department:[facility] [place]  Encounter Date:  01/13/2023  Attending Physician: Bobbette Lesch, MD   Age: 78 y.o. male  Patient Room: 09/09  PCP: Relyea, Cierra Jade, FNP           Diagnosis/Disposition:     Final diagnoses:   Fall, initial encounter   Acute back pain, unspecified back location, unspecified back pain laterality       ED Disposition       ED Disposition   Discharge from ED Extended Visit    Condition   --    Date/Time   Fri Jan 13, 2023  7:55 PM    Comment   --               Follow-Up Providers (if applicable)    Relyea, Cierra Jade, FNP  [address]  [place] [address]  [phone]    Schedule an appointment as soon as possible for a visit          Discharge Medication List as of 01/13/2023  7:55 PM              HPI and Medical Decision Making:       Nursing Triage note:      Chief complaint: Trauma and Fall    HPI: [name] is a 78 y.o. male  with acute complaint of a fall while transitioning from the wheelchair with head strike prior to arrival.  No loss of conscious.  Patient is on Xarelto .  Does report some back pain.  No neck pain.  No chest pain, dyspnea.  Patient normally wheelchair-bound.    Medical History[1]      Presenting acute/chronic problem(s): SEE HPI    Differential diagnosis to include but not limited to: Closed injury, subdural, subarachnoid, fracture, strain, contusion    Chronic illness impacting care and increasing risk of acute/chronic problems (obesity based on bmi >30, diabetes, hypertension, elderly - 65 or older): Patient is elderly and on anticoagulation with increased risk intracranial bleed    ______________________________________________________________________  Amount/complexity of Data Reviewed   L\  Look below for information    History obtained from another historian (parent, spouse, caregiver, ems) : See HPI    Review  of older external records from N/A and found this relevant information: N/A           Independent visualized and interpretation of radiological study by me:     Type of radiological study performed : N/A  Independent Interpretation by me: N/A    Diagnostic tests appropriately considered even if not ultimately performed: N/A    Discussion of management with other physicians and/or other caretakers:   Discussion and recommendation with  N/A. Patient condition and all pertinent labs and/or radiology studies discussed.     ______________________________________________________________________              ED COURSE/PLAN: CT head, CT cervical spine, CT thoracic and lumbar spine, chest x-ray, pelvis x-ray, labs    CT bony pelvis no fracture noted.  Appropriate for outpatient follow-up.                                Medications - No data to display    Home Medications               acetaminophen  (TYLENOL ) 325  MG tablet     2 tablets (650 mg) every 4 (four) hours as needed     acetaminophen  (TYLENOL ) 325 MG tablet     Take 2 tablets (650 mg) by mouth 2 (two) times daily     amLODIPine  (NORVASC ) 10 MG tablet     Take 1 tablet (10 mg) by mouth daily     amLODIPine  (NORVASC ) 10 MG tablet     Take 1 tablet (10 mg) by mouth daily     aspirin  EC 81 MG EC tablet     Take 1 tablet (81 mg) by mouth daily     aspirin  EC 81 MG EC tablet     Take 1 tablet (81 mg) by mouth daily     atorvastatin  (LIPITOR) 40 MG tablet     Take 1 tablet (40 mg) by mouth nightly     atorvastatin  (LIPITOR) 40 MG tablet     Take 1 tablet (40 mg) by mouth daily     buPROPion  SR (WELLBUTRIN  SR) 150 MG 12 hr tablet     Take 1 tablet (150 mg) by mouth 2 (two) times daily     buPROPion  SR (WELLBUTRIN  SR) 150 MG 12 hr tablet     Take 1 tablet (150 mg) by mouth 2 (two) times daily     carbidopa -levodopa  (SINEMET ) 25-100 MG per tablet     Take 1 tablet by mouth 3 (three) times daily     carboxymethylcellulose (REFRESH TEARS) 0.5 % ophthalmic solution     Place 1  drop into both eyes 2 (two) times daily     cetirizine  (ZyrTEC ) 10 MG chewable tablet     Chew 1 tablet (10 mg) by mouth daily     citalopram  (CeleXA ) 20 MG tablet          citalopram  (CeleXA ) 20 MG tablet     Take 1 tablet (20 mg) by mouth daily     cyanocobalamin  (VITAMIN B12) 500 MCG tablet     Take by mouth     donepezil  (ARICEPT ) 10 MG tablet          donepezil  (ARICEPT ) 10 MG tablet     Take 1 tablet (10 mg) by mouth nightly     gabapentin  (NEURONTIN ) 100 MG capsule     Take 1 capsule (100 mg) by mouth every 8 (eight) hours     Patient not taking: Reported on 03/16/2022     ipratropium (ATROVENT) 0.06 % nasal spray     2 sprays by Nasal route 4 (four) times daily     lactobacillus/streptococcus (RISAQUAD) Cap     Take 1 capsule by mouth daily     Patient not taking: Reported on 03/16/2022     lidocaine  (LIDODERM ) 5 %     Place 2 patches onto the skin every 24 hours Remove & Discard patch within 12 hours or as directed by MD     Patient not taking: Reported on 03/16/2022     loperamide (IMODIUM A-D) 2 MG tablet     Take 1 tablet (2 mg) by mouth 4 (four) times daily as needed for Diarrhea     methocarbamol  (ROBAXIN ) 500 MG tablet     Take 1 tablet (500 mg) by mouth every 8 (eight) hours     Patient not taking: Reported on 03/16/2022     Mometasone Furoate 50 MCG/ACT Aerosol     Inhale into the lungs     omeprazole (PriLOSEC) 20 MG capsule  omeprazole (PriLOSEC) 20 MG capsule     Take 1 capsule (20 mg) by mouth daily     oseltamivir (TAMIFLU) 75 MG capsule          rivaroxaban  (XARELTO ) 10 MG Tab     Take 1 tablet (10 mg) by mouth daily     rivaroxaban  (XARELTO ) 10 MG Tab     Take by mouth daily with dinner     tamsulosin  (FLOMAX ) 0.4 MG Cap     Take 1 capsule (0.4 mg) by mouth Daily after dinner     Patient not taking: Reported on 03/16/2022     vitamin B-12 (CYANOCOBALAMIN ) 500 MCG tablet     Take 1 tablet (500 mcg) by mouth daily     vitamins/minerals Tab     Take 1 tablet by mouth daily     Patient not  taking: Reported on 03/16/2022            Past Surgical History[2]    Social History     Socioeconomic History    Marital status: Widowed     Spouse name: Not on file    Number of children: Not on file    Years of education: Not on file    Highest education level: Not on file   Occupational History    Not on file   Tobacco Use    Smoking status: Former     Current packs/day: 0.00     Types: Cigarettes     Quit date: 1971     Years since quitting: 54.0    Smokeless tobacco: Never   Vaping Use    Vaping status: Never Used   Substance and Sexual Activity    Alcohol  use: Not Currently    Drug use: Never    Sexual activity: Not Currently   Other Topics Concern    Not on file   Social History Narrative    ** Merged History Encounter **          Social Drivers of Health     Financial Resource Strain: Low Risk  (06/28/2021)    Overall Financial Resource Strain (CARDIA)     Difficulty of Paying Living Expenses: Not hard at all   Food Insecurity: No Food Insecurity (10/27/2022)    Hunger Vital Sign     Worried About Running Out of Food in the Last Year: Never true     Ran Out of Food in the Last Year: Never true   Transportation Needs: No Transportation Needs (10/27/2022)    PRAPARE - Therapist, Art (Medical): No     Lack of Transportation (Non-Medical): No   Physical Activity: Unknown (06/28/2021)    Exercise Vital Sign     Days of Exercise per Week: 0 days     Minutes of Exercise per Session: Not on file   Stress: Stress Concern Present (06/28/2021)    [employer] of Occupational Health - Occupational Stress Questionnaire     Feeling of Stress : To some extent   Social Connections: Socially Isolated (06/28/2021)    Social Connection and Isolation Panel [NHANES]     Frequency of Communication with Friends and Family: Once a week     Frequency of Social Gatherings with Friends and Family: Once a week     Attends Religious Services: Never     Database Administrator or Organizations: No     Attends  Banker Meetings: Never  Marital Status: Widowed   Intimate Partner Violence: Not At Risk (10/27/2022)    Humiliation, Afraid, Rape, and Kick questionnaire     Fear of Current or Ex-Partner: No     Emotionally Abused: No     Physically Abused: No     Sexually Abused: No   Housing Stability: Low Risk  (10/27/2022)    Housing Stability Vital Sign     Unable to Pay for Housing in the Last Year: No     Number of Times Moved in the Last Year: 0     Homeless in the Last Year: No       Family History[3]      Review of Systems:  Physical Exam:     Review of Systems      Pulse 82  BP 116/62  Resp 20  SpO2 97 %  Temp 97.4 F (36.3 C)     Physical Exam  Constitutional:       Appearance: Normal appearance. He is well-developed.   HENT:      Head: Normocephalic and atraumatic.      Mouth/Throat:      Mouth: Mucous membranes are moist.   Eyes:      [name]: No scleral icterus.        Right eye: No discharge.         Left eye: No discharge.      Conjunctiva/sclera: Conjunctivae normal.   Neck:      Vascular: No JVD.      Trachea: No tracheal deviation.   Cardiovascular:      Rate and Rhythm: Normal rate and regular rhythm.      Heart sounds: Normal heart sounds. No murmur heard.     No friction rub. No gallop.   Pulmonary:      Effort: Pulmonary effort is normal. No respiratory distress.      Breath sounds: Normal breath sounds. No stridor. No wheezing or rales.   Abdominal:      [name]: Bowel sounds are normal. There is no distension.      Palpations: Abdomen is soft. There is no mass.      Tenderness: There is no abdominal tenderness. There is no guarding or rebound.   Musculoskeletal:         [name]: No tenderness. Normal range of motion.      Comments: Mildly tender over the lower lumbar spine    No tenderness over the cervical thoracic spine, c-collar left in place    No tenderness over bilateral ankles, knees, hips, wrists, shoulders, elbows    Small abrasion noted on the right knee   Skin:      [name]: Skin is warm and dry.   Neurological:      Mental Status: He is alert and oriented to person, place, and time.      GCS: GCS eye subscore is 4. GCS verbal subscore is 5. GCS motor subscore is 6.      Sensory: No sensory deficit.   Psychiatric:         Speech: Speech normal.         Behavior: Behavior normal.         Thought Content: Thought content normal.         Judgment: Judgment normal.              Interpretations, Clinical Decision Tools and Critical Care:              Results  Procedure Component Value Units Date/Time    Urine Drugs of Abuse Screen [[id number]]  (Abnormal) Collected: 01/13/23 1500    Specimen: Urine, Clean Catch Updated: 01/13/23 1556     Urine Amphetamine Screen Negative     Urine Barbituate Screen Negative     Urine Benzodiazepine Screen Negative     Urine Cannabinoid Screen Negative     Urine Cocaine Screen Negative     Urine Fentanyl  Screen Positive     Urine Opiate Screen Negative     Urine PCP Screen Negative    Narrative:      Testing performed on [employer] instrumentation.    Samples with a reaction greater than or equal to the following  cutoff levels yield a presumptively Positive result:    Amphetamines           1000 ng/mL  Barbiturates            200 ng/mL  Benzodiazepines         200 ng/mL  Cannabinoids (THC)       50 ng/mL  Cocaine                 300 ng/ml  Fentanyl                 1.0 ng/mL     Opiates                 300 ng/mL  PCP                      25 ng/mL    Drug Screens are unconfirmed and are for medical use only.   Call Lab within 1 week if GCMS confirmation is required.  If drug screen is negative and drug/medication use is still   suspected, order a blood and urine toxicology screen.     The Opiates screen is primarily for detection of morphine ,   heroin and codeine. Consumption of poppy seeds may produce a   positive opiates screening result. The THC screen does not   detect synthetic cannabinoid-like compounds.    Urinalysis with Reflex to Microscopic  Exam and Culture [[id number]]  (Abnormal) Collected: 01/13/23 1500    Specimen: Urine, Clean Catch Updated: 01/13/23 1539     Urine Color Straw     Urine Clarity Clear     Urine Specific Gravity 1.023     Urine pH 6.5     Urine Leukocyte Esterase Large     Urine Nitrite Positive     Urine Protein 20= Trace     Urine Glucose Negative     Urine Ketones Negative mg/dL      Urine Urobilinogen Normal mg/dL      Urine Bilirubin Negative     Urine Blood Negative     RBC, UA 3-5 /hpf      Urine WBC Too numerous to count /hpf      Urine Squamous Epithelial Cells 0-5 /hpf     Culture, Urine [[id number]] Collected: 01/13/23 1500    Specimen: Urine, Clean Catch Updated: 01/13/23 1539    Type and Screen [[id number]] Collected: 01/13/23 1353    Specimen: Blood, Venous Updated: 01/13/23 1439     ABO Rh B Pos     Antibody Screen Negative     Type And Screen Expiration 01/16/2023 23:59    Basic Metabolic Panel [[id number]]  (Normal) Collected: 01/13/23 1353    Specimen: Blood, Venous Updated: 01/13/23 1418     Glucose 87 mg/dL  BUN 23 mg/dL      Creatinine 1.0 mg/dL      Calcium  8.6 mg/dL      Sodium 859 mEq/L      Potassium 4.0 mEq/L      Chloride 108 mEq/L      CO2 23 mEq/L      Anion Gap 9.0     GFR >60.0 mL/min/1.73 m2     Ethanol (Alcohol ) Level [[id number]] Collected: 01/13/23 1353    Specimen: Blood, Venous Updated: 01/13/23 1418     Alcohol  Not Detected    PT/INR [[id number]]  (Normal) Collected: 01/13/23 1353    Specimen: Blood, Venous Updated: 01/13/23 1417     PT 12.8 sec      INR 1.1    APTT [[id number]]  (Normal) Collected: 01/13/23 1353    Specimen: Blood, Venous Updated: 01/13/23 1417     PTT 35 sec     CBC without Differential [[id number]]  (Abnormal) Collected: 01/13/23 1353    Specimen: Blood, Venous Updated: 01/13/23 1411     WBC 4.96 x10 3/uL      Hemoglobin 12.8 g/dL      Hematocrit 63.0 %      Platelet Count 133 x10 3/uL      MPV 9.8 fL      RBC 4.02 x10 6/uL      MCV 91.8 fL      MCH 31.8 pg      MCHC 34.7  g/dL      RDW 13 %      nRBC % 0.0 /100 WBC      Absolute nRBC 0.00 x10 3/uL             Radiology Results (24 Hour)       Procedure Component Value Units Date/Time    CT Boney Pelvis [[id number]] Collected: 01/13/23 1921    Order Status: Completed Updated: 01/13/23 1932    Narrative:      HISTORY: Pelvic pain following a recent fall injury.    COMPARISON: Pelvis x-ray performed earlier the same day. CT lumbar spine  exam performed the same day. CT abdomen/pelvis exam 10/23/2022.    TECHNIQUE: CT of the bony pelvis performed without intravenous contrast.  Multiplanar reconstructions were created and reviewed. The following dose  reduction techniques were utilized: automated exposure control and/or  adjustment of the mA and/or KV according to patient size, and the use of an  iterative reconstruction technique.    CONTRAST: None.    FINDINGS:    Bones: No acute fracture or malalignment, although diffuse osteoporosis  limits evaluation for nondisplaced fractures. There are minimal  degenerative changes of both hips, including slight joint space narrowing.  There are minimal degenerative changes of both sacroiliac joints. There are  degenerative changes of the lumbar spine, including lower lumbar facet  arthropathy.    Soft Tissues: There is asymmetric soft tissue swelling within the right  lateral hip, likely representing a soft tissue contusion injury. There is a  thin 4 cm hypodense collection within the right lateral hip extending along  the iliotibial tract, suspicious for a Morel-Lavalle lesion (hemolymphatic  collection secondary to a closed degloving injury). There is fat stranding  surrounding the bladder. Again seen is a VP shunt catheter. There is  colonic diverticulosis. There are vascular calcifications.        Impression:          1.No acute fracture or malalignment. Please note that diffuse osteoporosis  limits evaluation for nondisplaced fractures. If  there is persistent  clinical concern for a CT occult  fracture or if the patient has difficulty  bearing weight, an MRI exam of the bony pelvis is recommended for further  evaluation.    2.Asymmetric soft tissue swelling within the right lateral hip, likely  representing a soft tissue contusion injury.     3.Thin 4 cm hypodense collection within the right lateral hip extending  along the iliotibial tract, suspicious for a Morel-Lavalle lesion  (hemolymphatic collection secondary to a closed degloving injury).     4.Fat stranding surrounding the bladder, which can be seen in the clinical  setting of cystitis. Correlation with urinalysis is recommended.    Doyal Brightly, MD  01/13/2023 7:30 PM    CT Lumbar Spine WO Contrast [[id number]] Collected: 01/13/23 1454    Order Status: Completed Updated: 01/13/23 1503    Narrative:      HISTORY: Back trauma    COMPARISON: CT thoracic and lumbar spine 10/27/2022    TECHNIQUE: CT of the thoracic and lumbar spine performed without  intravenous contrast. Multiplanar reformatted images were created and  reviewed. The following dose reduction techniques were utilized: automated  exposure control and/or adjustment of the mA and/or KV according to patient  size, and the use of an iterative reconstruction technique.    CONTRAST: None.    FINDINGS:     THORACIC SPINE:    There are unchanged mild chronic superior endplate compression deformities  at T1 and T2. There is an unchanged moderate chronic superior endplate  compression deformity at T5. There is no spondylolisthesis. The facets are  normally aligned. There are relatively mild multilevel degenerative  changes.    LUMBAR SPINE:    There are unchanged chronic vertebral body compression deformities at L1  and L2. There is no new lumbar spine fracture. There is no  spondylolisthesis. The facets are normally aligned. There is no pars  defect. There are multilevel degenerative changes.      Impression:         1.No acute thoracic or lumbar spine fracture.  2.Unchanged chronic compression  deformities at T1, T2, T5, L1, and L2.    Gladis Minks, MD  01/13/2023 3:00 PM    CT Thoracic Spine WO Contrast [[id number]] Collected: 01/13/23 1454    Order Status: Completed Updated: 01/13/23 1503    Narrative:      HISTORY: Back trauma    COMPARISON: CT thoracic and lumbar spine 10/27/2022    TECHNIQUE: CT of the thoracic and lumbar spine performed without  intravenous contrast. Multiplanar reformatted images were created and  reviewed. The following dose reduction techniques were utilized: automated  exposure control and/or adjustment of the mA and/or KV according to patient  size, and the use of an iterative reconstruction technique.    CONTRAST: None.    FINDINGS:     THORACIC SPINE:    There are unchanged mild chronic superior endplate compression deformities  at T1 and T2. There is an unchanged moderate chronic superior endplate  compression deformity at T5. There is no spondylolisthesis. The facets are  normally aligned. There are relatively mild multilevel degenerative  changes.    LUMBAR SPINE:    There are unchanged chronic vertebral body compression deformities at L1  and L2. There is no new lumbar spine fracture. There is no  spondylolisthesis. The facets are normally aligned. There is no pars  defect. There are multilevel degenerative changes.      Impression:  1.No acute thoracic or lumbar spine fracture.  2.Unchanged chronic compression deformities at T1, T2, T5, L1, and L2.    Gladis Minks, MD  01/13/2023 3:00 PM    CT Head WO Contrast [[id number]] Collected: 01/13/23 1450    Order Status: Completed Updated: 01/13/23 1456    Narrative:      HISTORY: Head trauma    COMPARISON: CT head 10/27/2022    TECHNIQUE: CT of the head performed without intravenous contrast. The  following dose reduction techniques were utilized: automated exposure  control and/or adjustment of the mA and/or KV according to patient size,  and the use of an iterative reconstruction technique.    CONTRAST:  None.    FINDINGS:  No midline shift, mass effect, parenchymal hemorrhage, or evidence of acute  territorial infarction. There are unchanged areas of encephalomalacia in  the left frontal lobe. There are chronic infarcts in the left greater than  right cerebellar hemispheres.    There is a stable right frontal approach ventriculoperitoneal shunt with  catheter tip terminating in the right lateral ventricle adjacent to the  septum pellucidum. There is stable ventricular dilatation. There are no  extra-axial fluid collections.    There is scattered paranasal sinus mucosal thickening. The mastoids are  clear. The calvarium is intact.      Impression:         1.No intracranial hemorrhage or acute intracranial findings.  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    Gladis Minks, MD  01/13/2023 2:54 PM    CT Cervical Spine WO Contrast [[id number]] Collected: 01/13/23 1445    Order Status: Completed Updated: 01/13/23 1452    Narrative:      HISTORY: Neck trauma    COMPARISON: CT cervical spine 10/27/2022    TECHNIQUE: CT of the cervical spine performed without intravenous contrast.  Multiplanar reformatted images were created and reviewed. The following  dose reduction techniques were utilized: automated exposure control and/or  adjustment of the mA and/or KV according to patient size, and the use of an  iterative reconstruction technique.    CONTRAST: None.    FINDINGS:   There is no spondylolisthesis. The atlantooccipital and atlantoaxial  articulations are preserved. The odontoid is intact. The vertebral body  heights are preserved. There are multilevel degenerative changes. No acute  fracture. No prevertebral soft tissue swelling.      Impression:         No acute cervical spine fracture.    Gladis Minks, MD  01/13/2023 2:50 PM    XR Pelvis Portable [[id number]] Collected: 01/13/23 1424    Order Status: Completed  Updated: 01/13/23 1430    Narrative:      INDICATION: Pelvic pain status post trauma.     COMPARISON: None.    FINDINGS: Diffuse osteopenia is present. A linear lucency projecting at the  right pubic bone could be artifactual or could be an acute nondisplaced  fracture.  Bone alignment maintained.  Soft tissues are unremarkable. Mild  degenerative changes of the hips are present. There is a catheter noted  within the pelvis.      Impression:       A linear lucency at the right pubic bone could be artifactual  or could be an acute fracture. Osteopenia limits sensitivity for detection  of acute fractures elsewhere. CT of the pelvis is recommended for further  evaluation.    Derick Daunt, MD  01/13/2023 2:28 PM  XR Chest AP Portable [[id number]] Collected: 01/13/23 1410    Order Status: Completed Updated: 01/13/23 1414    Narrative:      HISTORY: Trauma.      COMPARISON: CT and x-ray 10/27/2022.    FINDINGS:     Cardiomediastinal contours are normal.    Lungs are clear. No lobar consolidation, effusion, or pneumothorax.    No acute osseous or soft tissue abnormality. Old left lateral sixth and  seventh rib fractures. No acute rib fracture detected. VP shunt.      Impression:        No acute process in the chest. No acute rib fracture detected. Old left  lateral rib fractures are again seen.    Hildegard Bayard, MD  01/13/2023 2:12 PM            *This note was generated by the [employer] EMR system/Voice recognition system and may contain inherent errors or omissions not intended by the user. Grammatical errors, random word insertions, deletions, pronoun errors and incomplete sentences are occasional consequences of this technology due to software limitations. Not all errors are caught or corrected. If there are questions or concerns about the content of this note or information contained within the body of this dictation they should be addressed directly with the author for clarification              Procedures:   Procedures             .p             [1]   Past Medical History:  Diagnosis Date    Aphasia     Apraxia     Atrial fibrillation     Chronic pain     Cognitive communication deficit     CVA (cerebral vascular accident)     Hemiplegia and hemiparesis following cerebral infarction affecting right dominant side     HLD (hyperlipidemia)     Hyperlipemia     Major depression     Major depression, recurrent     Parkinsons     Weakness    [2]   Past Surgical History:  Procedure Laterality Date    VENTRICULOPERITONEAL SHUNT  2018    shunt revision 2022   [3]   Family History  Problem Relation Name Age of Onset    Cancer Mother      Cancer Father      Hypertension Brother          Bobbette Lesch, MD  01/14/23 [phone]

## 2023-01-13 NOTE — ED Notes (Signed)
 Bed: 09  Expected date:   Expected time:   Means of arrival:   Comments:  Trauma pt #2

## 2023-01-13 NOTE — Consults (Signed)
 [facility]                 TRAUMA CONSULT NOTE   Date Time: 01/13/23 6:53 PM  Patient Name: [name]  Attending Physician: Bobbette Lesch, MD  Type of Admission: Emergency    Thank you Bobbette Lesch, MD for consulting us  on [name],[name] for:     REASON FOR CONSULTATION:   Trauma Modified Activation  CHIEF COMPLAINT:     Chief Complaint   Patient presents with    Trauma    Fall      ASSESSMENT/PLAN:   The patient has the following active problems:  Problem List[1]    Fall from wheelchair:  XR pelvis with linear lucency at right pubic bone, possible acute fracture. CT boney pelvis obtained and does not demonstrate acute fracture.  - WBAT  - multi modal pain regiman  - fall prevention  - PT/OT    UTI:  - u/a demonstrating large LE, + nitrites and TNTC WBC  - recommend initiation of ceftriaxone   - follow cultures    No admission required from trauma standpoint, should patient require admission, would have patient admitted to medical service. Trauma will perform tertiary survey 01/11 if admitted.     HPI:   [name] is a 78 y.o. who has  has a past medical history of Aphasia, Apraxia, Atrial fibrillation, Chronic pain, Cognitive communication deficit, CVA (cerebral vascular accident), Hemiplegia and hemiparesis following cerebral infarction affecting right dominant side, HLD (hyperlipidemia), Hyperlipemia, Major depression, Major depression, recurrent, Parkinsons, and Weakness. who presented after fall from a wheelchair  with headstrike. Patient was reportedly transitioning from a wheelchair to a walker with the assistance of a caregiver. +headstrike, no LOC. Patient on Xarelto  for atrial fibrillation.    The medications, past medical/surgical history, family history, allergies & full review of systems were:  Reviewed    Cervical spine clearance: Yes  Massive transfusion protocol:  No    Patient will be admitted to: LOCATION:  Floor    ALLERGIES:   Allergies[2]  MEDICATIONS:   (Not in a hospital admission)    PAST MEDICAL HISTORY:     Past Medical History:   Diagnosis Date    Aphasia     Apraxia     Atrial fibrillation     Chronic pain     Cognitive communication deficit     CVA (cerebral vascular accident)     Hemiplegia and hemiparesis following cerebral infarction affecting right dominant side     HLD (hyperlipidemia)     Hyperlipemia     Major depression     Major depression, recurrent     Parkinsons     Weakness      PAST SURGICAL HISTORY:   Past Surgical History[3]  FAMILY HISTORY:   Family History[4]  SOCIAL HISTORY:     Social History     Socioeconomic History    Marital status: Widowed     Spouse name: Not on file    Number of children: Not on file    Years of education: Not on file    Highest education level: Not on file   Occupational History    Not on file   Tobacco Use    Smoking status: Former     Current packs/day: 0.00     Types: Cigarettes     Quit date: 1971     Years since quitting: 54.0    Smokeless tobacco: Never   Vaping Use  Vaping status: Never Used   Substance and Sexual Activity    Alcohol  use: Not Currently    Drug use: Never    Sexual activity: Not Currently   Other Topics Concern    Not on file   Social History Narrative    ** Merged History Encounter **          Social Drivers of Health     Financial Resource Strain: Low Risk  (06/28/2021)    Overall Financial Resource Strain (CARDIA)     Difficulty of Paying Living Expenses: Not hard at all   Food Insecurity: No Food Insecurity (10/27/2022)    Hunger Vital Sign     Worried About Running Out of Food in the Last Year: Never true     Ran Out of Food in the Last Year: Never true   Transportation Needs: No Transportation Needs (10/27/2022)    PRAPARE - Therapist, Art (Medical): No     Lack of Transportation (Non-Medical): No   Physical Activity: Unknown (06/28/2021)    Exercise Vital Sign     Days of Exercise per Week: 0 days      Minutes of Exercise per Session: Not on file   Stress: Stress Concern Present (06/28/2021)    [employer] of Occupational Health - Occupational Stress Questionnaire     Feeling of Stress : To some extent   Social Connections: Socially Isolated (06/28/2021)    Social Connection and Isolation Panel [NHANES]     Frequency of Communication with Friends and Family: Once a week     Frequency of Social Gatherings with Friends and Family: Once a week     Attends Religious Services: Never     Database Administrator or Organizations: No     Attends Banker Meetings: Never     Marital Status: Widowed   Intimate Partner Violence: Not At Risk (10/27/2022)    Humiliation, Afraid, Rape, and Kick questionnaire     Fear of Current or Ex-Partner: No     Emotionally Abused: No     Physically Abused: No     Sexually Abused: No   Housing Stability: Low Risk  (10/27/2022)    Housing Stability Vital Sign     Unable to Pay for Housing in the Last Year: No     Number of Times Moved in the Last Year: 0     Homeless in the Last Year: No     REVIEW OF SYSTEMS:   Pertinent positives as per HPI above  All other systems reviewed and are negative  PHYSICAL EXAM:     Vitals:    01/13/23 1730 01/13/23 1816 01/13/23 1829 01/13/23 1830   BP: 132/59 143/67  131/55   Pulse: 67 68 68    Resp: 20 18 17     Temp:       TempSrc:       SpO2: 95% 95% 94%    Weight:         Patient Lines/Drains/Airways Status       Active PICC Line / CVC Line / PIV Line / Drain / Airway / Intraosseous Line / Epidural Line / ART Line / Line / Wound / Pressure Ulcer / NG/OG Tube       Name Placement date Placement time Site Days    Peripheral IV 01/13/23 22 G Diffusion Anterior;Distal;Left Upper Arm 01/13/23  1401  Upper Arm  less than 1  Constitutional: vital signs reviewed, appears comfortable and no acute distress  Eyes: pupils equal and reactive to light and extraocular eye movements intact  Head: atraumatic  Anterior neck: no wound,  contusions, or hematoma and trachea midline  Cervical spine: nontender  Chest wall: chest wall non tender  Lungs: breath sounds clear and equal bilaterally  Heart: regular rhythm Pulses equal  Abdomen: soft, non-tender, and nondistended  Pelvis: stable  Extremities: no wounds and no contusions  Thoracic and lumbar spine: tenderness: absent  Neurologic: GCS: 15, motor and sensory exam: normal  Fast Exam:  deferred    Intake and Output Summary (Last 24 hours) at Date Time    Intake/Output Summary (Last 24 hours) at 01/13/2023 1853  Last data filed at 01/13/2023 1351  Gross per 24 hour   Intake 0 ml   Output 0 ml   Net 0 ml     LABS:     Results       Procedure Component Value Units Date/Time    Urine Drugs of Abuse Screen [[id number]]  (Abnormal) Collected: 01/13/23 1500    Specimen: Urine, Clean Catch Updated: 01/13/23 1556     Urine Amphetamine Screen Negative     Urine Barbituate Screen Negative     Urine Benzodiazepine Screen Negative     Urine Cannabinoid Screen Negative     Urine Cocaine Screen Negative     Urine Fentanyl  Screen Positive     Urine Opiate Screen Negative     Urine PCP Screen Negative    Narrative:      Testing performed on [employer] instrumentation.    Samples with a reaction greater than or equal to the following  cutoff levels yield a presumptively Positive result:    Amphetamines           1000 ng/mL  Barbiturates            200 ng/mL  Benzodiazepines         200 ng/mL  Cannabinoids (THC)       50 ng/mL  Cocaine                 300 ng/ml  Fentanyl                 1.0 ng/mL     Opiates                 300 ng/mL  PCP                      25 ng/mL    Drug Screens are unconfirmed and are for medical use only.   Call Lab within 1 week if GCMS confirmation is required.  If drug screen is negative and drug/medication use is still   suspected, order a blood and urine toxicology screen.     The Opiates screen is primarily for detection of morphine ,   heroin and codeine. Consumption of poppy seeds may produce  a   positive opiates screening result. The THC screen does not   detect synthetic cannabinoid-like compounds.    Urinalysis with Reflex to Microscopic Exam and Culture [[id number]]  (Abnormal) Collected: 01/13/23 1500    Specimen: Urine, Clean Catch Updated: 01/13/23 1539     Urine Color Straw     Urine Clarity Clear     Urine Specific Gravity 1.023     Urine pH 6.5     Urine Leukocyte Esterase Large     Urine Nitrite Positive  Urine Protein 20= Trace     Urine Glucose Negative     Urine Ketones Negative mg/dL      Urine Urobilinogen Normal mg/dL      Urine Bilirubin Negative     Urine Blood Negative     RBC, UA 3-5 /hpf      Urine WBC Too numerous to count /hpf      Urine Squamous Epithelial Cells 0-5 /hpf     Culture, Urine [[id number]] Collected: 01/13/23 1500    Specimen: Urine, Clean Catch Updated: 01/13/23 1539    Urine Gray Culture Hold Tube [[id number]] Collected: 01/13/23 1500    Specimen: Urine, Clean Catch Updated: 01/13/23 1509    Type and Screen [[id number]] Collected: 01/13/23 1353    Specimen: Blood, Venous Updated: 01/13/23 1439     ABO Rh B Pos     Antibody Screen Negative     Type And Screen Expiration 01/16/2023 23:59    Basic Metabolic Panel [[id number]]  (Normal) Collected: 01/13/23 1353    Specimen: Blood, Venous Updated: 01/13/23 1418     Glucose 87 mg/dL      BUN 23 mg/dL      Creatinine 1.0 mg/dL      Calcium  8.6 mg/dL      Sodium 859 mEq/L      Potassium 4.0 mEq/L      Chloride 108 mEq/L      CO2 23 mEq/L      Anion Gap 9.0     GFR >60.0 mL/min/1.73 m2     Ethanol (Alcohol ) Level [[id number]] Collected: 01/13/23 1353    Specimen: Blood, Venous Updated: 01/13/23 1418     Alcohol  Not Detected    PT/INR [[id number]]  (Normal) Collected: 01/13/23 1353    Specimen: Blood, Venous Updated: 01/13/23 1417     PT 12.8 sec      INR 1.1    APTT [[id number]]  (Normal) Collected: 01/13/23 1353    Specimen: Blood, Venous Updated: 01/13/23 1417     PTT 35 sec     CBC without Differential [[id number]]   (Abnormal) Collected: 01/13/23 1353    Specimen: Blood, Venous Updated: 01/13/23 1411     WBC 4.96 x10 3/uL      Hemoglobin 12.8 g/dL      Hematocrit 63.0 %      Platelet Count 133 x10 3/uL      MPV 9.8 fL      RBC 4.02 x10 6/uL      MCV 91.8 fL      MCH 31.8 pg      MCHC 34.7 g/dL      RDW 13 %      nRBC % 0.0 /100 WBC      Absolute nRBC 0.00 x10 3/uL           RADS:   Radiological procedure personally reviewed:     CT Lumbar Spine WO Contrast    Result Date: 01/13/2023   1.No acute thoracic or lumbar spine fracture. 2.Unchanged chronic compression deformities at T1, T2, T5, L1, and L2. Gladis Minks, MD 01/13/2023 3:00 PM    CT Thoracic Spine WO Contrast    Result Date: 01/13/2023   1.No acute thoracic or lumbar spine fracture. 2.Unchanged chronic compression deformities at T1, T2, T5, L1, and L2. Gladis Minks, MD 01/13/2023 3:00 PM    CT Head WO Contrast    Result Date: 01/13/2023   1.No intracranial hemorrhage or acute intracranial findings. 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  3.Unchanged chronic infarcts in the supratentorial and infratentorial brain. Gladis Minks, MD 01/13/2023 2:54 PM    CT Cervical Spine WO Contrast    Result Date: 01/13/2023   No acute cervical spine fracture. Gladis Minks, MD 01/13/2023 2:50 PM    XR Pelvis Portable    Result Date: 01/13/2023   A linear lucency at the right pubic bone could be artifactual or could be an acute fracture. Osteopenia limits sensitivity for detection of acute fractures elsewhere. CT of the pelvis is recommended for further evaluation. Derick Daunt, MD 01/13/2023 2:28 PM    XR Chest AP Portable    Result Date: 01/13/2023  No acute process in the chest. No acute rib fracture detected. Old left lateral rib fractures are again seen. Hildegard Bayard, MD 01/13/2023 2:12 PM   EKG Results       ** No results found for the last 48 hours. **            I have personally reviewed the patient's history and 24 hour interval events, along with vitals,  labs, radiology images and nursing notes, as well as the medical record from the previous hospitalizations, and outside records available. So far today I have spent 61 minutes providing care for this patient excluding teaching and billable procedures, and not overlapping with any other providers with greater than 50% of the time in counseling or coordination of care. SABRA    SIGNED BY:     Almarie Million, FNP    *This note was generated by the Epic EMR system/ Dragon speech recognition and may contain inherent errors or omissions not intended by the user. Grammatical errors, random word insertions, deletions, pronoun errors and incomplete sentences are occasional consequences of this technology due to software limitations. Not all errors are caught or corrected. If there are questions or concerns about the content of this note or information contained within the body of this dictation they should be addressed directly with the author for clarification          [1]   Patient Active Problem List  Diagnosis    NPH (normal pressure hydrocephalus)    S/P VP shunt    HLD (hyperlipidemia)    Closed fracture of multiple ribs of left side with routine healing    Primary hypertension    Other hyperlipidemia    Atrial fibrillation    CVA (cerebral vascular accident)    Aphasia    Apraxia   [2]   Allergies  Allergen Reactions    Ace Inhibitors      Other reaction(s): Cough    Ace Inhibitors    [3]   Past Surgical History:  Procedure Laterality Date    VENTRICULOPERITONEAL SHUNT  2018    shunt revision 2022   [4]   Family History  Problem Relation Name Age of Onset    Cancer Mother      Cancer Father      Hypertension Brother

## 2023-01-14 LAB — LAB USE ONLY - URINE GRAY CULTURE HOLD TUBE

## 2023-01-15 LAB — CHEM 8 POCT
Anion Gap POCT: 19 meq/L — ABNORMAL HIGH (ref 5–15)
BUN POCT: 23 mg/dL (ref 8–26)
CO2 POCT: 23 meq/L (ref 23–33)
Calcium Ionized POCT: 2.3 meq/L — ABNORMAL LOW (ref 2.44–2.64)
Chloride POCT: 105 meq/L (ref 98–109)
Creatinine POCT: 1.2 mg/dL (ref 0.9–1.3)
GFR: >=60 mL/min/{1.73_m2} (ref >=60.0)
Glucose POCT: 86 mg/dL (ref 70–100)
Hematocrit POCT: 40 % (ref 37.6–49.6)
Hemoglobin POCT: 13.6 g/dL — ABNORMAL LOW (ref 14.0–17.0)
Potassium POCT: 3.9 meq/L (ref 3.5–4.9)
Sodium POCT: 141 meq/L (ref 136–146)

## 2023-01-15 LAB — CULTURE, URINE: Culture Urine: >100000 — AB

## 2023-01-16 MED ORDER — CEPHALEXIN 500 MG PO CAPS
500.0000 mg | ORAL_CAPSULE | Freq: Three times a day (TID) | ORAL | 0 refills | Status: AC
Start: 2023-01-16 — End: 2023-01-23

## 2023-01-16 NOTE — Progress Notes (Signed)
 Ucx > 100,000 MSSA on no a bx.Recommend to contact pt : how is pt feeling ? Has pt had f/u ? If worsening ssx return to ED.     Recommend keflex  faxed     Note sent.   Your urine culture is Positive for bacteria/infection.   Recommend antibiotics cephalexin  faxed to pharmacy     If you have any questions recommend to contact your primary care doctor and /or [facility] at [phone]     . Please contact your primary care doctor within a week. If worsening signs and symptoms return to ED    Please do not respond to this email.     LOIS Bones, MD  [facility]  [facility].

## 2023-02-27 ENCOUNTER — Encounter: Payer: Self-pay | Admitting: Nurse Practitioner

## 2023-02-27 ENCOUNTER — Ambulatory Visit: Payer: Medicare Other | Attending: Neurological Surgery | Admitting: Nurse Practitioner

## 2023-02-27 VITALS — BP 111/71 | HR 66 | Resp 16 | Ht 68.0 in | Wt 170.0 lb

## 2023-02-27 DIAGNOSIS — Z982 Presence of cerebrospinal fluid drainage device: Secondary | ICD-10-CM | POA: Insufficient documentation

## 2023-02-27 DIAGNOSIS — G912 (Idiopathic) normal pressure hydrocephalus: Secondary | ICD-10-CM | POA: Insufficient documentation

## 2023-02-27 NOTE — Progress Notes (Signed)
 [facility]  Follow up Note    Previous Impression/Plan     Impression   78 y.o. 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     Plan   Using the shunt programmer the setting was found at 4. The shunt was reprogrammed to 5     Follow-up   PRN     HPI     Chief Complaint   Patient presents with    Follow-up     VPS for NPH     [name] 78 y.o. 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 He presents today after having 4 falls in the past couple of months. He reports its due to difficulty transferring. Daughter is present and reports changes in his moods (depressed and angry). They would like to have their shunt re-programmed.       Physical Examination   VITAL SIGNS:     Vitals:    02/27/23 1347   BP: 111/71   Pulse: 66   Resp: 16          Neurologic Exam    Mental Status    Alert   Speech clear      Cranial Nerves      CN III, IV, VI   Pupils are equal, round, and reactive to light.  Extraocular motions are normal.      CN VII: Facial expression full, symmetric.     CN VIII: Hearing intact b/l    CN XI: Shoulder shrug symmetric    Motor Exam   Overall muscle tone: normal     Strength   BUE/BLE 5/5     Gait: normal    Shunt valve pumps and refills     Review of Systems   Review of Systems  Constitutional: negative for fever or chills.  HENT: Negative for tinnitus or rhinorrhea   Eyes: Negative for visual disturbance.   Musculoskeletal: Negative for gait problem. Negative for neck pain. Negative for back pain.   Skin: Negative for wounds.  Neurological: no history of seizures.  Respiratory: Negative for cough or wheezing  Endocrine: Negative for cold or heat intolerance   GI: Negative for constipation or diarrhea     Radiology Interpretation     HISTORY: Head trauma     COMPARISON: CT head  10/27/2022     TECHNIQUE: CT of the head performed without intravenous contrast. The  following dose reduction techniques were utilized: automated exposure  control and/or adjustment of the mA and/or KV according to patient size,  and the use of an iterative reconstruction technique.     CONTRAST: None.     FINDINGS:  No midline shift, mass effect, parenchymal hemorrhage, or evidence of acute  territorial infarction. There are unchanged areas of encephalomalacia in  the left frontal lobe. There are chronic infarcts in the left greater than  right cerebellar hemispheres.     There is a stable right frontal approach ventriculoperitoneal shunt with  catheter tip terminating in the right lateral ventricle adjacent to the  septum pellucidum. There is stable ventricular dilatation. There are no  extra-axial fluid collections.     There is scattered paranasal sinus mucosal thickening. The mastoids are  clear. The calvarium is intact.     IMPRESSION:  1.No intracranial hemorrhage or acute intracranial findings.  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     Gladis Minks, MD  01/13/2023 2:54 PM    The images above were personally reviewed and discussed in detail with the patient.       Impression   78 y.o. 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     Recent falls- HCT showed no evidence of acute hemorrhage and stable ventriculomegaly when compared to 10/24    Plan   Using the shunt programmer, the setting was checked and found to be at 5  Shunt is programmable and MRI compatible   Encouraged to follow up with neurologist Dr. Alethea    Follow-up   PRN     Molly Milian, NP

## 2023-04-02 ENCOUNTER — Emergency Department

## 2023-04-02 ENCOUNTER — Inpatient Hospital Stay
Admission: EM | Admit: 2023-04-02 | Discharge: 2023-04-06 | DRG: 057 | Disposition: A | Discharge status: Skilled Nursing Facility | Attending: Family Medicine | Admitting: Family Medicine

## 2023-04-02 DIAGNOSIS — Z86718 Personal history of other venous thrombosis and embolism: Secondary | ICD-10-CM

## 2023-04-02 DIAGNOSIS — R4701 Aphasia: Secondary | ICD-10-CM | POA: Diagnosis present

## 2023-04-02 DIAGNOSIS — G3183 Dementia with Lewy bodies: Secondary | ICD-10-CM | POA: Diagnosis present

## 2023-04-02 DIAGNOSIS — R079 Chest pain, unspecified: Secondary | ICD-10-CM

## 2023-04-02 DIAGNOSIS — I2510 Atherosclerotic heart disease of native coronary artery without angina pectoris: Secondary | ICD-10-CM | POA: Diagnosis present

## 2023-04-02 DIAGNOSIS — E782 Mixed hyperlipidemia: Secondary | ICD-10-CM | POA: Diagnosis present

## 2023-04-02 DIAGNOSIS — Z79899 Other long term (current) drug therapy: Secondary | ICD-10-CM

## 2023-04-02 DIAGNOSIS — G912 (Idiopathic) normal pressure hydrocephalus: Secondary | ICD-10-CM | POA: Diagnosis present

## 2023-04-02 DIAGNOSIS — Z7982 Long term (current) use of aspirin: Secondary | ICD-10-CM

## 2023-04-02 DIAGNOSIS — I480 Paroxysmal atrial fibrillation: Secondary | ICD-10-CM

## 2023-04-02 DIAGNOSIS — E785 Hyperlipidemia, unspecified: Secondary | ICD-10-CM | POA: Diagnosis present

## 2023-04-02 DIAGNOSIS — I69351 Hemiplegia and hemiparesis following cerebral infarction affecting right dominant side: Secondary | ICD-10-CM

## 2023-04-02 DIAGNOSIS — I10 Essential (primary) hypertension: Secondary | ICD-10-CM

## 2023-04-02 DIAGNOSIS — Z7901 Long term (current) use of anticoagulants: Secondary | ICD-10-CM

## 2023-04-02 DIAGNOSIS — Z982 Presence of cerebrospinal fluid drainage device: Secondary | ICD-10-CM

## 2023-04-02 DIAGNOSIS — Z86711 Personal history of pulmonary embolism: Secondary | ICD-10-CM

## 2023-04-02 DIAGNOSIS — D6851 Activated protein C resistance: Secondary | ICD-10-CM | POA: Diagnosis present

## 2023-04-02 DIAGNOSIS — N3000 Acute cystitis without hematuria: Principal | ICD-10-CM | POA: Diagnosis present

## 2023-04-02 DIAGNOSIS — Z87891 Personal history of nicotine dependence: Secondary | ICD-10-CM

## 2023-04-02 DIAGNOSIS — D649 Anemia, unspecified: Secondary | ICD-10-CM | POA: Diagnosis present

## 2023-04-02 DIAGNOSIS — F32A Depression, unspecified: Secondary | ICD-10-CM | POA: Diagnosis present

## 2023-04-02 DIAGNOSIS — Z955 Presence of coronary angioplasty implant and graft: Secondary | ICD-10-CM

## 2023-04-02 DIAGNOSIS — J8410 Pulmonary fibrosis, unspecified: Secondary | ICD-10-CM | POA: Diagnosis present

## 2023-04-02 DIAGNOSIS — G20A1 Parkinson's disease without dyskinesia, without mention of fluctuations: Principal | ICD-10-CM | POA: Diagnosis present

## 2023-04-02 DIAGNOSIS — F0283 Dementia in other diseases classified elsewhere, unspecified severity, with mood disturbance: Secondary | ICD-10-CM | POA: Diagnosis present

## 2023-04-02 HISTORY — DX: Adjustment disorder with mixed anxiety and depressed mood: F43.23

## 2023-04-02 HISTORY — DX: Pulmonary fibrosis, unspecified: J84.10

## 2023-04-02 HISTORY — DX: Benign neoplasm of rectum: D12.8

## 2023-04-02 HISTORY — DX: Varicose veins of bilateral lower extremities with other complications: I83.893

## 2023-04-02 HISTORY — DX: Parkinson's disease without dyskinesia, without mention of fluctuations: G20.A1

## 2023-04-02 HISTORY — DX: Essential (primary) hypertension: I10

## 2023-04-02 HISTORY — DX: Transient cerebral ischemic attack, unspecified: G45.9

## 2023-04-02 HISTORY — DX: Personal history of other venous thrombosis and embolism: Z86.718

## 2023-04-02 HISTORY — DX: Venous insufficiency (chronic) (peripheral): I87.2

## 2023-04-02 HISTORY — DX: Atherosclerotic heart disease of native coronary artery without angina pectoris: I25.10

## 2023-04-02 LAB — URINALYSIS WITH REFLEX TO MICROSCOPIC EXAM - REFLEX TO CULTURE
Urine Bilirubin: NEGATIVE
Urine Blood: NEGATIVE
Urine Glucose: NEGATIVE
Urine Nitrite: NEGATIVE
Urine Specific Gravity: 1.031 (ref 1.001–1.035)
Urine Urobilinogen: NORMAL mg/dL (ref 0.2–2.0)
Urine pH: 6 (ref 5.0–8.0)

## 2023-04-02 LAB — COMPREHENSIVE METABOLIC PANEL
ALT: <6 U/L (ref <=55)
AST (SGOT): 16 U/L (ref <=41)
Albumin/Globulin Ratio: 1 (ref 0.9–2.2)
Albumin: 4.2 g/dL (ref 3.5–5.0)
Alkaline Phosphatase: 88 U/L (ref 37–117)
Anion Gap: 12 (ref 5.0–15.0)
BUN: 25 mg/dL (ref 9–28)
Bilirubin, Total: 0.4 mg/dL (ref 0.2–1.2)
CO2: 21 meq/L (ref 17–29)
Calcium: 9.2 mg/dL (ref 7.9–10.2)
Chloride: 103 meq/L (ref 99–111)
Creatinine: 0.8 mg/dL (ref 0.5–1.5)
GFR: >60 mL/min/{1.73_m2} (ref >=60.0)
Globulin: 4.4 g/dL — ABNORMAL HIGH (ref 2.0–3.6)
Glucose: 83 mg/dL (ref 70–100)
Potassium: 3.8 meq/L (ref 3.5–5.3)
Protein, Total: 8.6 g/dL — ABNORMAL HIGH (ref 6.0–8.3)
Sodium: 136 meq/L (ref 135–145)

## 2023-04-02 LAB — LAB USE ONLY - CBC WITH DIFFERENTIAL
Absolute Basophils: 0.04 10*3/uL (ref 0.00–0.08)
Absolute Eosinophils: 0.13 10*3/uL (ref 0.00–0.44)
Absolute Immature Granulocytes: 0.02 10*3/uL (ref 0.00–0.07)
Absolute Lymphocytes: 1.9 10*3/uL (ref 0.42–3.22)
Absolute Monocytes: 0.51 10*3/uL (ref 0.21–0.85)
Absolute Neutrophils: 3.02 10*3/uL (ref 1.10–6.33)
Absolute nRBC: 0 10*3/uL (ref <=0.00)
Basophils %: 0.7 %
Eosinophils %: 2.3 %
Hematocrit: 38 % (ref 37.6–49.6)
Hemoglobin: 13.2 g/dL (ref 12.5–17.1)
Immature Granulocytes %: 0.4 %
Lymphocytes %: 33.8 %
MCH: 31.9 pg (ref 25.1–33.5)
MCHC: 34.7 g/dL (ref 31.5–35.8)
MCV: 91.8 fL (ref 78.0–96.0)
MPV: 9.7 fL (ref 8.9–12.5)
Monocytes %: 9.1 %
Neutrophils %: 53.7 %
Platelet Count: 161 10*3/uL (ref 142–346)
Preliminary Absolute Neutrophil Count: 3.02 10*3/uL (ref 1.10–6.33)
RBC: 4.14 10*6/uL — ABNORMAL LOW (ref 4.20–5.90)
RDW: 12 % (ref 11–15)
WBC: 5.62 10*3/uL (ref 3.10–9.50)
nRBC %: 0 /100{WBCs} (ref <=0.0)

## 2023-04-02 LAB — COVID-19 (SARS-COV-2) & INFLUENZA  A/B, NAA (ROCHE LIAT)
Influenza A RNA: NOT DETECTED
Influenza B RNA: NOT DETECTED
SARS-CoV-2 (COVID-19) RNA: NOT DETECTED

## 2023-04-02 LAB — HIGH SENSITIVITY TROPONIN-I WITH DELTA
hs Troponin-I Delta: 2
hs Troponin: 5.5 ng/L (ref <=35.0)

## 2023-04-02 LAB — HIGH SENSITIVITY TROPONIN-I: hs Troponin: 7.9 ng/L (ref <=35.0)

## 2023-04-02 MED ORDER — MELATONIN 3 MG PO TABS
3.0000 mg | ORAL_TABLET | Freq: Every evening | ORAL | Status: DC | PRN
Start: 2023-04-02 — End: 2023-04-06

## 2023-04-02 MED ORDER — VITAMIN B-12 500 MCG PO TABS
500.0000 ug | ORAL_TABLET | Freq: Every day | ORAL | Status: DC
Start: 2023-04-03 — End: 2023-04-06
  Administered 2023-04-03 – 2023-04-06 (×4): 500 ug via ORAL
  Filled 2023-04-02 (×4): qty 1

## 2023-04-02 MED ORDER — SODIUM CHLORIDE 0.9 % IV SOLN
INTRAVENOUS | Status: DC
Start: 2023-04-02 — End: 2023-04-03
  Filled 2023-04-02: qty 1000

## 2023-04-02 MED ORDER — BUPROPION HCL ER (SR) 150 MG PO TB12
150.0000 mg | ORAL_TABLET | Freq: Two times a day (BID) | ORAL | Status: DC
Start: 2023-04-03 — End: 2023-04-06
  Administered 2023-04-03 – 2023-04-06 (×7): 150 mg via ORAL
  Filled 2023-04-02 (×8): qty 1

## 2023-04-02 MED ORDER — RISAQUAD PO CAPS
1.0000 | ORAL_CAPSULE | Freq: Every day | ORAL | Status: DC
Start: 2023-04-02 — End: 2023-04-06
  Administered 2023-04-03 – 2023-04-06 (×4): 1 via ORAL
  Filled 2023-04-02 (×4): qty 1

## 2023-04-02 MED ORDER — CARBOXYMETHYLCELLULOSE SODIUM 0.5 % OP SOLN
1.0000 [drp] | Freq: Two times a day (BID) | OPHTHALMIC | Status: DC
Start: 2023-04-02 — End: 2023-04-06
  Administered 2023-04-03 – 2023-04-06 (×8): 1 [drp] via OPHTHALMIC
  Filled 2023-04-02: qty 1

## 2023-04-02 MED ORDER — ONDANSETRON HCL 4 MG/2ML IJ SOLN
4.0000 mg | INTRAMUSCULAR | Status: DC | PRN
Start: 2023-04-02 — End: 2023-04-06
  Administered 2023-04-05 – 2023-04-06 (×3): 4 mg via INTRAVENOUS
  Filled 2023-04-02 (×3): qty 2

## 2023-04-02 MED ORDER — PANTOPRAZOLE SODIUM 40 MG PO TBEC
40.0000 mg | DELAYED_RELEASE_TABLET | Freq: Every morning | ORAL | Status: DC
Start: 2023-04-03 — End: 2023-04-06
  Administered 2023-04-03 – 2023-04-06 (×4): 40 mg via ORAL
  Filled 2023-04-02 (×4): qty 1

## 2023-04-02 MED ORDER — ASPIRIN 81 MG PO TBEC
81.0000 mg | DELAYED_RELEASE_TABLET | Freq: Every day | ORAL | Status: DC
Start: 2023-04-02 — End: 2023-04-06
  Administered 2023-04-03 – 2023-04-06 (×4): 81 mg via ORAL
  Filled 2023-04-02 (×4): qty 1

## 2023-04-02 MED ORDER — ATORVASTATIN CALCIUM 20 MG PO TABS
40.0000 mg | ORAL_TABLET | Freq: Every evening | ORAL | Status: DC
Start: 2023-04-02 — End: 2023-04-06
  Administered 2023-04-03 – 2023-04-05 (×4): 40 mg via ORAL
  Filled 2023-04-02 (×4): qty 2

## 2023-04-02 MED ORDER — DOCUSATE SODIUM 100 MG PO CAPS
100.0000 mg | ORAL_CAPSULE | Freq: Two times a day (BID) | ORAL | Status: DC
Start: 2023-04-02 — End: 2023-04-06
  Administered 2023-04-03 – 2023-04-05 (×5): 100 mg via ORAL
  Filled 2023-04-02 (×6): qty 1

## 2023-04-02 MED ORDER — CARBIDOPA-LEVODOPA 25-100 MG PO TABS
1.0000 | ORAL_TABLET | Freq: Three times a day (TID) | ORAL | Status: DC
Start: 2023-04-02 — End: 2023-04-03
  Administered 2023-04-03 (×3): 1 via ORAL
  Filled 2023-04-02 (×3): qty 1

## 2023-04-02 MED ORDER — AMLODIPINE BESYLATE 5 MG PO TABS
10.0000 mg | ORAL_TABLET | Freq: Every day | ORAL | Status: DC
Start: 2023-04-02 — End: 2023-04-06
  Administered 2023-04-03 – 2023-04-06 (×4): 10 mg via ORAL
  Filled 2023-04-02 (×4): qty 2

## 2023-04-02 MED ORDER — ACETAMINOPHEN 325 MG PO TABS
650.0000 mg | ORAL_TABLET | ORAL | Status: DC | PRN
Start: 2023-04-02 — End: 2023-04-06

## 2023-04-02 MED ORDER — ALUM & MAG HYDROXIDE-SIMETH 200-200-20 MG/5ML PO SUSP
15.0000 mL | ORAL | Status: DC | PRN
Start: 2023-04-02 — End: 2023-04-06

## 2023-04-02 MED ORDER — CETIRIZINE HCL 10 MG PO TABS
10.0000 mg | ORAL_TABLET | Freq: Every day | ORAL | Status: DC
Start: 2023-04-03 — End: 2023-04-06
  Administered 2023-04-03 – 2023-04-06 (×4): 10 mg via ORAL
  Filled 2023-04-02 (×4): qty 1

## 2023-04-02 MED ORDER — STERILE WATER FOR INJECTION IJ/IV SOLN (WRAP)
1.0000 g | Freq: Once | INTRAMUSCULAR | Status: AC
Start: 2023-04-02 — End: 2023-04-02
  Administered 2023-04-02: 1 g via INTRAVENOUS
  Filled 2023-04-02: qty 1000

## 2023-04-02 MED ORDER — HALOPERIDOL LACTATE 5 MG/ML IJ SOLN
2.0000 mg | INTRAMUSCULAR | Status: DC | PRN
Start: 2023-04-02 — End: 2023-04-06

## 2023-04-02 MED ORDER — CITALOPRAM HYDROBROMIDE 20 MG PO TABS
20.0000 mg | ORAL_TABLET | Freq: Every day | ORAL | Status: DC
Start: 2023-04-02 — End: 2023-04-06
  Administered 2023-04-03 – 2023-04-06 (×5): 20 mg via ORAL
  Filled 2023-04-02 (×5): qty 1

## 2023-04-02 MED ORDER — SODIUM CHLORIDE 0.9 % IV MBP
1.0000 g | INTRAVENOUS | Status: DC
Start: 2023-04-03 — End: 2023-04-08
  Administered 2023-04-03: 1 g via INTRAVENOUS
  Filled 2023-04-02: qty 1000

## 2023-04-02 MED ORDER — CALCIUM CARBONATE ANTACID 500 MG PO CHEW
1000.0000 mg | CHEWABLE_TABLET | Freq: Three times a day (TID) | ORAL | Status: DC | PRN
Start: 2023-04-02 — End: 2023-04-06

## 2023-04-02 NOTE — ED Triage Notes (Signed)
 [facility] with c/o SOB, which resolved prior to EMS arrival. Pt endorsing generalized weakness. Per EMS, staff reported pt tested positive for COVID.     Pt PMHx stroke with aphasia, per staff Pt at baseline

## 2023-04-02 NOTE — ED Notes (Signed)
 Bed: 16  Expected date: 04/02/23  Expected time: 4:15 PM  Means of arrival:   Comments:  COVID

## 2023-04-02 NOTE — H&P (Signed)
 [facility]PITAL[facility] H&P Note  Patient Info:   Date/Time: 04/02/2023 / 8:59 PM   Admit Date:04/02/2023  Patient Name:[name]   FMW:[id number]   PCP: Relyea, Cierra Jade, FNP  Attending Physician:Wayman Hoard L, DO    Assessment/Plan:   Generalized weakness due to UTI:  Empiric ceftriaxone .  Gentle IV hydration given his advanced age.  Influenza A and B, rapid COVID-negative in the ED.  Perhaps his positive COVID 3 days ago was a false positive or he was at the end of a COVID infection and is since turned negative.  PT/OT consults for safety and stability.    Paroxysmal atrial fibrillation/chronic anticoagulation on Xarelto :  His EKG is normal today.  And he has no chest pain or pulmonary complaints.  We will continue Xarelto .  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    Normal pressure hydrocephalus/VP shunt:  Initially placed 09/09/2016 with shunt revision (valve) in 2022.  He is followed periodically by neurology on an outpatient basis.    Hypertension:  Normotensive here, continue current medications and trend BPs.    Hyperlipidemia:  Normal transaminases, continue statin.    Overweight per BMI criteria  Patient has a BMI of 26.28 kg/m2    DVT Prophylaxis: SCDs and Xarelto     Central Line/Foley Catheter/PICC line status: None    Code Status: Full Code    Disposition:home    Type of Admission:Observation     Estimated Length of Stay (including stay in the ER receiving treatment): < 48 hours    Medical decision making complexity: Moderate complexity.  Elderly male with prior stroke who lives in assisted living with generalized weakness and fatigue, 3 days ago tested positive for COVID but negative here multiple comorbid states and has a UTI.  Clinical Information and History:   Chief Complaint:  Chief Complaint   Patient presents with    Shortness of Breath    Generalized weakness             Clinical Presentation: HPI:   [name] is a very pleasant 78 y.o. male who is a fair historian.  History is obtained from him, the ED record and my review of his medical records in epic and Care Everywhere.  There were no family members present during my exam.  He lives at [facility] and an assisted living situation.  He has had 1 week history of generalized weakness and fatigue, no other specific systemic complaints.  He had a couple episodes of vomiting a few days ago but none since.  Denies fevers, chills, ENT/sinus symptoms, chest pain or discomfort, palpitations, shortness of breath, cough, abdominal pain, diarrhea, constipation, UTI symptoms, recent travel or ill contacts.  He did see his PCP 3 days ago and was diagnosed with COVID but today's test is negative.  He does have a UTI but denies any complaints.  He has a history of a prior stroke with residual mild right-sided weakness and stuttering speech which is baseline for him    Review of Systems:   General: Denies fever chills sweats.  Generalized weakness and fatigue  HEENT: Denies nasal/sinus congestion  Cardiac: Denies chest pain or discomfort, palpitations  Pulmonary: Denies shortness of breath, dyspnea on exertion, orthopnea, cough  Abdomen: Denies abdominal pain, nausea, diarrhea, constipation.  1 episode of vomiting  GU: Denies UTI symptoms    Physical Exam:     Vitals:    04/02/23 1624  04/02/23 2015 04/02/23 2030   BP: 132/58 143/58 132/62   Pulse: 66 67 64   Resp: 22  21   Temp: 97.6 F (36.4 C)     TempSrc: Temporal     SpO2: 96% 96% 95%   Weight: 78.4 kg (172 lb 13.5 oz)       Physical Exam:   General: Well-developed, well-nourished thin but not cachectic chronically ill-appearing elderly man in no acute distress at this time  HEENT: sclera nonicteric, conjunctiva clear.  Lips dry  Neck: Supple, no JVD  Heart: Regular rate and rhythm, S1-S2, no murmur gallop or rub  Chest: Nontender to palpation or compression  Lungs: Clear to auscultation, decreased at bases nonlabored  respirations  Abdomen: Soft, nontender nondistended normal active bowel sounds no hepatojugular reflux  Extremities: No clubbing cyanosis edema.  Mild chronic right sided weakness (sequela of prior stroke)  Neuro: Alert oriented x3 stuttering speech and right-sided weakness sequela of old stroke, unchanged  Skin: Warm dry mucous membranes dry, moderate pallor, no rash.  Psych: Good eye contact, appropriate mood affect stuttering speech, well-groomed    PMH/PSH/FH/SH  Past Medical History:   Diagnosis Date    Abnormal cardiovascular stress test 09/19/2011    Echo/stress suggest RCA/left circumflex distribution ischemia; negative heart cath in 2001.  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)     History of DVT of lower extremity     Hyperlipemia     Hypertension     Major depression, recurrent     Mild cognitive impairment 10/16/2015    Normal pressure hydrocephalus (CMS/HCC) 06/21/2016    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    Parkinson's disease (CMS/HCC)     Parkinsons (CMS/HCC)     Phlebitis and thrombophlebitis of superficial vessels of lower extremities, bilateral 09/30/2011    Pulmonary fibrosis (CMS/HCC)     Shunt malfunction, subsequent encounter 09/04/2020    recent stroke and symptoms of shunt failure with shuntogram w/ evidence of distal obstruction, s/p elective: 09/04/2020: R frontal VP shunt revision (valve revision)    Situational mixed anxiety and depressive disorder     TIA (transient ischemic attack)      Tubular adenoma of rectum     Varicose veins of both legs with edema     Venous (peripheral) insufficiency        Past Surgical History:   Procedure Laterality Date    CARDIAC CATHETERIZATION  09/2011    Normal coronary arteries    CORONARY ANGIOPLASTY WITH STENT PLACEMENT  2017    NASAL POLYP EXCISION      PHACOEMULSIFICATION, IOL CATARACT Right 2013    Laser    VENTRICULOPERITONEAL SHUNT  09/09/2016    shunt revision 2022       Social History   Widowed, lives at [facility] in an assisted living situation  Tobacco Use    Smoking status: Former     Current packs/day: 0.00  Types: Cigarettes     Quit date: 1971     Years since quitting: 54.2    Smokeless tobacco: Never   Vaping Use    Vaping status: Never Used   Substance Use Topics    Alcohol  use: Not Currently    Drug use: Never        Family History:  Family History   Problem Relation    Breast cancer Mother        Metastatic to brain, died age 7    Throat cancer Father        Died age 69    Lung cancer Father    Hypertension Brother    Heart disease Brother    Prostate cancer Maternal Grandfather    Cancer Paternal Grandfather    Suicidality Son    Reviewed    Allergies:  Allergies   Allergen Reactions    Ace Inhibitors Cough       Medications:  Current Outpatient Medications   Medication Instructions    acetaminophen  (TYLENOL ) 650 mg, Every 4 hours PRN    acetaminophen  (TYLENOL ) 650 mg, 2 times daily    ALPRAZolam (XANAX) 0.25 MG tablet     amLODIPine  (NORVASC ) 10 mg, Daily    aspirin  EC 81 MG EC tablet 1 tablet, Daily    atorvastatin  (LIPITOR) 40 MG tablet 1 tablet, At bedtime    buPROPion  SR (WELLBUTRIN  SR) 150 mg, 2 times daily    carbidopa -levodopa  (SINEMET ) 25-100 MG per tablet 1 tablet, 3 times daily    carboxymethylcellulose (REFRESH TEARS) 0.5 % ophthalmic solution 1 drop, 2 times daily    cetirizine  (ZYRTEC ) 10 mg, Daily    citalopram  (CELEXA ) 20 mg, Daily    cyanocobalamin  (VITAMIN B12) 500 MCG tablet Take by mouth    donepezil  (ARICEPT ) 10 mg, At  bedtime    gabapentin  (NEURONTIN ) 100 mg, Oral, Every 8 hours scheduled    ipratropium (ATROVENT) 0.06 % nasal spray 2 sprays, Nasal, 4 times daily    lactobacillus/streptococcus (RISAQUAD) Cap 1 capsule, Oral, Daily    lidocaine  (LIDODERM ) 5 % 2 patches, Transdermal, Every 24 hours, Remove & Discard patch within 12 hours or as directed by MD    loperamide (IMODIUM A-D) 2 mg, Oral, 4 times daily PRN    methocarbamol  (ROBAXIN ) 500 mg, Oral, Every 8 hours scheduled    Mometasone Furoate 50 MCG/ACT Aerosol Inhale into the lungs    omeprazole (PRILOSEC) 20 mg, Daily    oseltamivir (TAMIFLU) 75 MG capsule     rivaroxaban  (XARELTO ) 10 MG Tab 1 tablet, Daily    tamsulosin  (FLOMAX ) 0.4 mg, Oral, Daily after dinner    vitamin B-12 (CYANOCOBALAMIN ) 500 mcg, Daily    vitamins/minerals Tab 1 tablet, Daily        Results of Labs/imaging:   Labs have been reviewed:   Results       Procedure Component Value Units Date/Time    High Sensitivity Troponin-I at 2 hrs with calculated Delta [[id number]] Collected: 04/02/23 1846    Specimen: Blood, Venous Updated: 04/02/23 1928     hs Troponin 5.5 ng/L      hs Troponin-I Delta 2    Urinalysis with Reflex to Microscopic Exam and Culture [[id number]]  (Abnormal) Collected: 04/02/23 1846    Specimen: Urine, Clean Catch Updated: 04/02/23 1912     Urine Color Yellow     Urine Clarity Clear     Urine Specific Gravity 1.031     Urine pH 6.0  Urine Leukocyte Esterase Large     Urine Nitrite Negative     Urine Protein 10= Trace     Urine Glucose Negative     Urine Ketones 10= 1+ mg/dL      Urine Urobilinogen Normal mg/dL      Urine Bilirubin Negative     Urine Blood Negative     RBC, UA 0-2 /hpf      Urine WBC 26-50 /hpf      Urine Squamous Epithelial Cells 0-5 /hpf     Culture, Urine [[id number]] Collected: 04/02/23 1846    Specimen: Urine, Clean Catch Updated: 04/02/23 1912    Urine Gray Culture Hold Tube [[id number]] Collected: 04/02/23 1846    Specimen: Urine, Clean Catch Updated: 04/02/23  1852    High Sensitivity Troponin-I at 0 hrs [[id number]]  (Normal) Collected: 04/02/23 1643    Specimen: Blood, Venous Updated: 04/02/23 1713     hs Troponin 7.9 ng/L     Comprehensive Metabolic Panel [[id number]]  (Abnormal) Collected: 04/02/23 1643    Specimen: Blood, Venous Updated: 04/02/23 1712     Glucose 83 mg/dL      BUN 25 mg/dL      Creatinine 0.8 mg/dL      Sodium 863 mEq/L      Potassium 3.8 mEq/L      Chloride 103 mEq/L      CO2 21 mEq/L      Calcium  9.2 mg/dL      Anion Gap 87.9     GFR >60.0 mL/min/1.73 m2      AST (SGOT) 16 U/L      ALT <6 U/L      Alkaline Phosphatase 88 U/L      Albumin 4.2 g/dL      Protein, Total 8.6 g/dL      Globulin 4.4 g/dL      Albumin/Globulin Ratio 1.0     Bilirubin, Total 0.4 mg/dL     RNCPI-80 (SARS-CoV-2) and Influenza A/B, NAA (Liat)- Admission [[id number]]  (Normal) Collected: 04/02/23 1637    Specimen: Swab from Anterior Nares Updated: 04/02/23 1711     SARS-CoV-2 (COVID-19) RNA Not Detected     Influenza A RNA Not Detected     Influenza B RNA Not Detected    Narrative:      A result of Detected indicates POSITIVE for the presence of viral RNA  A result of Not Detected indicates NEGATIVE for the presence of viral RNA    Test performed using the Roche cobas Liat SARS-CoV-2 & Influenza A/B assay. This is a multiplex real-time RT-PCR assay for the detection of SARS-CoV-2, influenza A, and influenza B virus RNA. Viral nucleic acids may persist in vivo, independent of viability. Detection of viral nucleic acid does not imply the presence of infectious virus, or that virus nucleic acid is the cause of clinical symptoms. Negative results do not preclude SARS-CoV-2, influenza A, and/or influenza B infection and should not be used as the sole basis for diagnosis, treatment or other patient management decisions. Invalid results may be due to inhibiting substances in the specimen and recollection should occur.     CBC with Differential (Order) [[id number]]  (Abnormal)  Collected: 04/02/23 1643    Specimen: Blood, Venous Updated: 04/02/23 1651    Narrative:      The following orders were created for panel order CBC with Differential (Order).  Procedure  Abnormality         Status                     ---------                               -----------         ------                     CBC with Differential (...[[id number]]  Abnormal            Final result                 Please view results for these tests on the individual orders.    CBC with Differential (Component) [[id number]]  (Abnormal) Collected: 04/02/23 1643    Specimen: Blood, Venous Updated: 04/02/23 1651     WBC 5.62 x10 3/uL      Hemoglobin 13.2 g/dL      Hematocrit 61.9 %      Platelet Count 161 x10 3/uL      MPV 9.7 fL      RBC 4.14 x10 6/uL      MCV 91.8 fL      MCH 31.9 pg      MCHC 34.7 g/dL      RDW 12 %      nRBC % 0.0 /100 WBC      Absolute nRBC 0.00 x10 3/uL      Preliminary Absolute Neutrophil Count 3.02 x10 3/uL      Neutrophils % 53.7 %      Lymphocytes % 33.8 %      Monocytes % 9.1 %      Eosinophils % 2.3 %      Basophils % 0.7 %      Immature Granulocytes % 0.4 %      Absolute Neutrophils 3.02 x10 3/uL      Absolute Lymphocytes 1.90 x10 3/uL      Absolute Monocytes 0.51 x10 3/uL      Absolute Eosinophils 0.13 x10 3/uL      Absolute Basophils 0.04 x10 3/uL      Absolute Immature Granulocytes 0.02 x10 3/uL     Light Blue - Citrate Hold Tube [[id number]] Collected: 04/02/23 1643    Specimen: Blood, Venous Updated: 04/02/23 1646    Rainbow Draw [[id number]] Collected: 04/02/23 1643    Specimen: Blood, Venous Updated: 04/02/23 1646    Narrative:      The following orders were created for panel order Rainbow Draw.  Procedure                               Abnormality         Status                     ---------                               -----------         ------                     Viviane METER Hold Ulaz[[id number]]  In process                 Light  Blue - Citrate Ho.SABRA.[[id number]]                      In process                   Please view results for these tests on the individual orders.    Gold SST Hold Tube [[id number]] Collected: 04/02/23 1643    Specimen: Blood, Venous Updated: 04/02/23 1646            Radiology reports have been reviewed:  CT Head WO Contrast  Result Date: 04/02/2023  1.No acute intracranial abnormality.   2.Stable chronic findings, as described.   Milan Arne Jumbo, MD 04/02/2023 6:03 PM      Chest AP Portable  Result Date: 04/02/2023  No radiographic evidence of acute cardiopulmonary process.   Loras GORMAN Penton, MD 04/02/2023 5:09 PM     Hospitalist:   Signed by:   Geanna Divirgilio L Rivkah Wolz, DO  04/02/2023 8:59 PM  Time spent in evaluating and managing the care of the patient including face-to-face and non-face-to-face:   40 minutes    *This note was generated by the Epic EMR system/ Dragon speech recognition and may contain inherent errors or omissions not intended by the user. Grammatical errors, random word insertions, deletions, pronoun errors and incomplete sentences are occasional consequences of this technology due to software limitations. Not all errors are caught or corrected. If there are questions or concerns about the content of this note or information contained within the body of this dictation they should be addressed directly with the author for clarification.

## 2023-04-02 NOTE — ED to IP RN Note (Signed)
 [facility]  ED NURSING NOTE FOR THE RECEIVING INPATIENT NURSE   ED NURSE [place] [phone]   ED CHARGE RN lisa   ADMISSION INFORMATION   [name] is a 78 y.o. male admitted with an ED diagnosis of:    1. Acute cystitis without hematuria         Isolation: None   Allergies: Ace inhibitors and Ace inhibitors   Holding Orders confirmed? No   Belongings Documented? No   Home medications sent to pharmacy confirmed? No   NURSING CARE   Patient Comes From:   Mental Status: Assisted living  alert and oriented   ADL: Unable to walk   Ambulation: 2 person assist   Pertinent Information  and Safety Concerns:     [place] Violence Risk Level: Low please call RN     CT / NIH   CT Head ordered on this patient?  No   NIH/Dysphagia assessment done prior to admission? No   VITAL SIGNS (at the time of this note)      Vitals:    04/02/23 2015   BP: 143/58   Pulse: 67   Resp:    Temp:    SpO2: 96%     Pain Score: 0-No pain (04/02/23 1624)

## 2023-04-02 NOTE — ED Provider Notes (Signed)
 EMERGENCY DEPARTMENT NOTE       HISTORY OF PRESENT ILLNESS     [name] is a 78 y.o. male with Chief Complaint: Shortness of Breath and Generalized weakness         Nursing Triage Note:       History of Present Illness  The patient, with a history of stroke and recent COVID-19 diagnosis, presents with generalized weakness. He reports feeling 'really weak' all over, with no specific side being more affected than the other. He also mentions a recent episode of vomiting a couple of days ago. He denies any cough, congestion, chest pain, fever, or sore throat. He reports normal urination and no abdominal pain. He has not had any recent falls and is unsure if he is on blood thinners. He was able to eat breakfast on the day of the visit.    REVIEW OF SYSTEMS     Review of Systems See History of Present Illness    PHYSICAL EXAM     Pulse 66  BP 132/58  Resp 22  SpO2 96 %  Temp 97.6 F (36.4 C)       Constitutional:       Appearance: Ill appearance.   HENT:      Head: Normocephalic and atraumatic.   Cardiovascular:      Rate and Rhythm: Normal rate.   Pulmonary:      Effort: Pulmonary effort is normal. No respiratory distress.   Abdominal:      General: There is no distension.   Musculoskeletal:         General: No swelling.      Cervical back: No midline bony tenderness.      Thoracic back: No midline bony tenderness.      Lumbar back: No midline bony tenderness.   Skin:     General: Skin is warm.      Capillary Refill: Capillary refill takes less than 2 seconds.   Neurological:      General: No focal deficit present.      Mental Status: Pt is with general slowed sensorium.        Vital Signs: I have reviewed the patient's vital signs.   Nursing Notes: I have reviewed and utilized available nursing triage note.  Alternate Historian: History obtained from another historian (parent, spouse, caregiver, EMS) : See HPI       EKG interpretation  Interpreted by Dr. Derek, emergency medicine  physician.  EKG is Abnormal and nonspecific, sinus rhythm  ST segments show nonspecific abnormalities but there is no acute st elevation.  Rate is normal, 67  No significant widened intervals.    Differential Diagnoses including but not limited to: covid, dehydration, stroke    Assessment & Plan  UTI, COVID-19  Residing in an assisted living facility, he was diagnosed with COVID-19. Initially presented with dyspnea, which resolved by evaluation. Reports generalized weakness and an episode of emesis two days ago. No cough, congestion, chest pain, or fever. History of cerebrovascular accident with residual unilateral weakness, unclear if current weakness is related to this or COVID-19.  - Order blood work, chest X-ray, urinalysis, and blood glucose levels.  Abx for uti         EMERGENCY DEPT. MEDICATIONS      ED Medication Orders (From admission, onward)      Start Ordered     Status Ordering Provider    04/03/23 2000 04/02/23 2024  cefTRIAXone  (ROCEPHIN ) 1 g in sodium chloride  0.9 %  100 mL IVPB mini-bag plus  Every 24 hours        Route: Intravenous  Ordered Dose: 1 g       Ordered TRAFICANTE, DIANE L    04/03/23 0900 04/02/23 2023  cetirizine  (ZyrTEC ) tablet 10 mg  Daily        Route: Oral  Ordered Dose: 10 mg       Ordered TRAFICANTE, DIANE L    04/03/23 0900 04/02/23 2023  vitamin B-12 (CYANOCOBALAMIN ) tablet 500 mcg  Daily        Route: Oral  Ordered Dose: 500 mcg       Ordered TRAFICANTE, DIANE L    04/03/23 0745 04/02/23 2023  pantoprazole  (PROTONIX ) EC tablet 40 mg  Every morning before breakfast        Route: Oral  Ordered Dose: 40 mg       Ordered TRAFICANTE, DIANE L    04/02/23 2200 04/02/23 2023  amLODIPine  (NORVASC ) tablet 10 mg  Daily        Route: Oral  Ordered Dose: 10 mg       Ordered TRAFICANTE, DIANE L    04/02/23 2200 04/02/23 2023  aspirin  EC tablet 81 mg  Daily        Route: Oral  Ordered Dose: 81 mg       Ordered TRAFICANTE, DIANE L    04/02/23 2200 04/02/23 2023  atorvastatin  (LIPITOR) tablet  40 mg  At bedtime        Route: Oral  Ordered Dose: 40 mg       Ordered TRAFICANTE, DIANE L    04/02/23 2200 04/02/23 2023  carbidopa -levodopa  (SINEMET ) 25-100 MG per tablet 1 tablet  3 times daily        Route: Oral  Ordered Dose: 1 tablet       Ordered TRAFICANTE, DIANE L    04/02/23 2200 04/02/23 2024  lactobacillus/streptococcus (RISAQUAD) capsule 1 capsule  Daily        Route: Oral  Ordered Dose: 1 capsule       Ordered TRAFICANTE, DIANE L    04/02/23 2200 04/02/23 2024  docusate sodium  (COLACE) capsule 100 mg  2 times daily        Route: Oral  Ordered Dose: 100 mg       Ordered TRAFICANTE, DIANE L    04/02/23 2025 04/02/23 2024  0.9% NaCl infusion  Continuous        Route: Intravenous       Ordered TRAFICANTE, DIANE L    04/02/23 2024 04/02/23 2024  melatonin tablet 3 mg  At bedtime PRN        Route: Oral  Ordered Dose: 3 mg       Ordered TRAFICANTE, DIANE L    04/02/23 2024 04/02/23 2024  haloperidol  lactate (HALDOL ) injection 2 mg  Every 4 hours PRN        Route: Intravenous  Ordered Dose: 2 mg       Ordered TRAFICANTE, DIANE L    04/02/23 2024 04/02/23 2024  alum & mag hydroxide-simethicone (MAALOX PLUS) 200-200-20 mg/5 mL suspension 15 mL  Every 4 hours PRN        Route: Oral  Ordered Dose: 15 mL       Ordered TRAFICANTE, DIANE L    04/02/23 2024 04/02/23 2024  calcium  carbonate (TUMS) chewable tablet 1,000 mg  3 times daily PRN        Route: Oral  Ordered Dose: 1,000 mg  Ordered TRAFICANTE, DIANE L    04/02/23 2024 04/02/23 2024  ondansetron  (ZOFRAN ) injection 4 mg  Every 4 hours PRN        Route: Intravenous  Ordered Dose: 4 mg       Ordered TRAFICANTE, DIANE L    04/02/23 2024 04/02/23 2024  acetaminophen  (TYLENOL ) tablet 650 mg  Every 4 hours PRN        Route: Oral  Ordered Dose: 650 mg       Ordered TRAFICANTE, DIANE L    04/02/23 2024 04/02/23 2023  buPROPion  SR (WELLBUTRIN  SR) 12 hr tablet 150 mg  2 times daily        Route: Oral  Ordered Dose: 150 mg       Ordered TRAFICANTE, DIANE L     04/02/23 2024 04/02/23 2023  carboxymethylcellulose (REFRESH TEARS) 0.5 % ophthalmic solution 1 drop  2 times daily        Route: Both Eyes  Ordered Dose: 1 drop       Ordered TRAFICANTE, DIANE L    04/02/23 2024 04/02/23 2023  citalopram  (CeleXA ) tablet 20 mg  Daily        Route: Oral  Ordered Dose: 20 mg       Ordered TRAFICANTE, DIANE L    04/02/23 1953 04/02/23 1952  cefTRIAXone  (ROCEPHIN ) 1 g in sterile water  (preservative free) 10 mL IV push injection  Once        Route: Intravenous  Ordered Dose: 1 g       Last MAR action: Given Danashia Landers J            Discussion of management with other physicians and/or other caretakers:  Daughter, bedside.  Expressed desire for pt to be observed in the hospital.    Patient condition and all pertinent labs and/or radiology studies discussed.             Counseling: I have spoken with the patient and discussed today's findings, in addition to providing specific details for the plan of care.  Questions are answered and there is agreement with the plan.      Clinical Impression:  Final diagnoses:   Acute cystitis without hematuria       Disposition:  ED Disposition       ED Disposition   Observation    Condition   --    Date/Time   Sun Apr 02, 2023  8:17 PM    Comment   Admitting Physician: COLBY LOA CROME [[id number]]   Service:: Medicine [[id number]]   Estimated Length of Stay: < 2 midnights   Tentative Discharge Plan?: Home or Self Care [1]   Special Needs:: Fall Risk                     MEDICAL HISTORY     Past Medical History:  Past Medical History:   Diagnosis Date    Aphasia     Apraxia     Atrial fibrillation (CMS/HCC)     Chronic pain     Closed fracture of multiple ribs of left side with routine healing 11/03/2021    Cognitive communication deficit     CVA (cerebral vascular accident) (CMS/HCC)     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)     HLD (hyperlipidemia)     Hyperlipemia     Major depression     Major  depression, recurrent     Parkinsons (CMS/HCC)  Weakness        Past Surgical History:  Past Surgical History[1]    Social History:  Social History[2]    Family History:  Family History[3]    Outpatient Medication:  Previous Medications    ACETAMINOPHEN  (TYLENOL ) 325 MG TABLET    2 tablets (650 mg) every 4 (four) hours as needed    ACETAMINOPHEN  (TYLENOL ) 325 MG TABLET    Take 2 tablets (650 mg) by mouth 2 (two) times daily    ALPRAZOLAM (XANAX) 0.25 MG TABLET        AMLODIPINE  (NORVASC ) 10 MG TABLET    Take 1 tablet (10 mg) by mouth daily    ASPIRIN  EC 81 MG EC TABLET    Take 1 tablet (81 mg) by mouth daily    ATORVASTATIN  (LIPITOR) 40 MG TABLET    Take 1 tablet (40 mg) by mouth nightly    BUPROPION  SR (WELLBUTRIN  SR) 150 MG 12 HR TABLET    Take 1 tablet (150 mg) by mouth 2 (two) times daily    CARBIDOPA -LEVODOPA  (SINEMET ) 25-100 MG PER TABLET    Take 1 tablet by mouth 3 (three) times daily    CARBOXYMETHYLCELLULOSE (REFRESH TEARS) 0.5 % OPHTHALMIC SOLUTION    Place 1 drop into both eyes 2 (two) times daily    CETIRIZINE  (ZYRTEC ) 10 MG CHEWABLE TABLET    Chew 1 tablet (10 mg) by mouth daily    CITALOPRAM  (CELEXA ) 20 MG TABLET    Take 1 tablet (20 mg) by mouth daily    CYANOCOBALAMIN  (VITAMIN B12) 500 MCG TABLET    Take by mouth    DONEPEZIL  (ARICEPT ) 10 MG TABLET    Take 1 tablet (10 mg) by mouth nightly    GABAPENTIN  (NEURONTIN ) 100 MG CAPSULE    Take 1 capsule (100 mg) by mouth every 8 (eight) hours    IPRATROPIUM (ATROVENT) 0.06 % NASAL SPRAY    2 sprays by Nasal route 4 (four) times daily    LACTOBACILLUS/STREPTOCOCCUS (RISAQUAD) CAP    Take 1 capsule by mouth daily    LIDOCAINE  (LIDODERM ) 5 %    Place 2 patches onto the skin every 24 hours Remove & Discard patch within 12 hours or as directed by MD    LOPERAMIDE (IMODIUM A-D) 2 MG TABLET    Take 1 tablet (2 mg) by mouth 4 (four) times daily as needed for Diarrhea    METHOCARBAMOL  (ROBAXIN ) 500 MG TABLET    Take 1 tablet (500 mg) by mouth every 8 (eight)  hours    MOMETASONE FUROATE 50 MCG/ACT AEROSOL    Inhale into the lungs    OMEPRAZOLE (PRILOSEC) 20 MG CAPSULE    Take 1 capsule (20 mg) by mouth daily    OSELTAMIVIR (TAMIFLU) 75 MG CAPSULE        RIVAROXABAN  (XARELTO ) 10 MG TAB    Take 1 tablet (10 mg) by mouth daily    TAMSULOSIN  (FLOMAX ) 0.4 MG CAP    Take 1 capsule (0.4 mg) by mouth Daily after dinner    VITAMIN B-12 (CYANOCOBALAMIN ) 500 MCG TABLET    Take 1 tablet (500 mcg) by mouth daily    VITAMINS/MINERALS TAB    Take 1 tablet by mouth daily         SUPPLEMENTAL DATA:          Results        Results       Procedure Component Value Units Date/Time    High Sensitivity Troponin-I at  2 hrs with calculated Delta [[id number]] Collected: 04/02/23 1846    Specimen: Blood, Venous Updated: 04/02/23 1928     hs Troponin 5.5 ng/L      hs Troponin-I Delta 2    Urinalysis with Reflex to Microscopic Exam and Culture [[id number]]  (Abnormal) Collected: 04/02/23 1846    Specimen: Urine, Clean Catch Updated: 04/02/23 1912     Urine Color Yellow     Urine Clarity Clear     Urine Specific Gravity 1.031     Urine pH 6.0     Urine Leukocyte Esterase Large     Urine Nitrite Negative     Urine Protein 10= Trace     Urine Glucose Negative     Urine Ketones 10= 1+ mg/dL      Urine Urobilinogen Normal mg/dL      Urine Bilirubin Negative     Urine Blood Negative     RBC, UA 0-2 /hpf      Urine WBC 26-50 /hpf      Urine Squamous Epithelial Cells 0-5 /hpf     Culture, Urine [[id number]] Collected: 04/02/23 1846    Specimen: Urine, Clean Catch Updated: 04/02/23 1912    Urine Gray Culture Hold Tube [[id number]] Collected: 04/02/23 1846    Specimen: Urine, Clean Catch Updated: 04/02/23 1852    High Sensitivity Troponin-I at 0 hrs [[id number]]  (Normal) Collected: 04/02/23 1643    Specimen: Blood, Venous Updated: 04/02/23 1713     hs Troponin 7.9 ng/L     Comprehensive Metabolic Panel [[id number]]  (Abnormal) Collected: 04/02/23 1643    Specimen: Blood, Venous Updated: 04/02/23 1712      Glucose 83 mg/dL      BUN 25 mg/dL      Creatinine 0.8 mg/dL      Sodium 863 mEq/L      Potassium 3.8 mEq/L      Chloride 103 mEq/L      CO2 21 mEq/L      Calcium  9.2 mg/dL      Anion Gap 87.9     GFR >60.0 mL/min/1.73 m2      AST (SGOT) 16 U/L      ALT <6 U/L      Alkaline Phosphatase 88 U/L      Albumin 4.2 g/dL      Protein, Total 8.6 g/dL      Globulin 4.4 g/dL      Albumin/Globulin Ratio 1.0     Bilirubin, Total 0.4 mg/dL     RNCPI-80 (SARS-CoV-2) and Influenza A/B, NAA (Liat)- Admission [[id number]]  (Normal) Collected: 04/02/23 1637    Specimen: Swab from Anterior Nares Updated: 04/02/23 1711     SARS-CoV-2 (COVID-19) RNA Not Detected     Influenza A RNA Not Detected     Influenza B RNA Not Detected    Narrative:      A result of Detected indicates POSITIVE for the presence of viral RNA  A result of Not Detected indicates NEGATIVE for the presence of viral RNA    Test performed using the Roche cobas Liat SARS-CoV-2 & Influenza A/B assay. This is a multiplex real-time RT-PCR assay for the detection of SARS-CoV-2, influenza A, and influenza B virus RNA. Viral nucleic acids may persist in vivo, independent of viability. Detection of viral nucleic acid does not imply the presence of infectious virus, or that virus nucleic acid is the cause of clinical symptoms. Negative results do not preclude SARS-CoV-2, influenza A, and/or influenza B infection and should not be  used as the sole basis for diagnosis, treatment or other patient management decisions. Invalid results may be due to inhibiting substances in the specimen and recollection should occur.     CBC with Differential (Order) [[id number]]  (Abnormal) Collected: 04/02/23 1643    Specimen: Blood, Venous Updated: 04/02/23 1651    Narrative:      The following orders were created for panel order CBC with Differential (Order).  Procedure                               Abnormality         Status                     ---------                                -----------         ------                     CBC with Differential (...[[id number]]  Abnormal            Final result                 Please view results for these tests on the individual orders.    CBC with Differential (Component) [[id number]]  (Abnormal) Collected: 04/02/23 1643    Specimen: Blood, Venous Updated: 04/02/23 1651     WBC 5.62 x10 3/uL      Hemoglobin 13.2 g/dL      Hematocrit 61.9 %      Platelet Count 161 x10 3/uL      MPV 9.7 fL      RBC 4.14 x10 6/uL      MCV 91.8 fL      MCH 31.9 pg      MCHC 34.7 g/dL      RDW 12 %      nRBC % 0.0 /100 WBC      Absolute nRBC 0.00 x10 3/uL      Preliminary Absolute Neutrophil Count 3.02 x10 3/uL      Neutrophils % 53.7 %      Lymphocytes % 33.8 %      Monocytes % 9.1 %      Eosinophils % 2.3 %      Basophils % 0.7 %      Immature Granulocytes % 0.4 %      Absolute Neutrophils 3.02 x10 3/uL      Absolute Lymphocytes 1.90 x10 3/uL      Absolute Monocytes 0.51 x10 3/uL      Absolute Eosinophils 0.13 x10 3/uL      Absolute Basophils 0.04 x10 3/uL      Absolute Immature Granulocytes 0.02 x10 3/uL     Light Blue - Citrate Hold Tube [[id number]] Collected: 04/02/23 1643    Specimen: Blood, Venous Updated: 04/02/23 1646    Rainbow Draw [[id number]] Collected: 04/02/23 1643    Specimen: Blood, Venous Updated: 04/02/23 1646    Narrative:      The following orders were created for panel order Rainbow Draw.  Procedure                               Abnormality         Status                     ---------                               -----------         ------  Gold SST Hold [id number]]                              In process                 Light Blue - Citrate Ho.SABRA.[[id number]]                      In process                   Please view results for these tests on the individual orders.    Gold SST Hold Tube [[id number]] Collected: 04/02/23 1643    Specimen: Blood, Venous Updated: 04/02/23 1646               Radiology results (if any)  Radiology  Results (24 Hour)       Procedure Component Value Units Date/Time    CT Head WO Contrast [[id number]] Collected: 04/02/23 1757    Order Status: Completed Updated: 04/02/23 1805    Narrative:      HISTORY: Severe generalized weakness.     COMPARISON: CT head 01/13/2023.    TECHNIQUE: CT of the head performed without intravenous contrast. The  following dose reduction techniques were utilized: automated exposure  control and/or adjustment of the mA and/or KV according to patient size,  and the use of an iterative reconstruction technique.    CONTRAST: None.    FINDINGS:    No evidence of acute intracranial hemorrhage or acute territorial infarct.  Unchanged chronic infarcts in the left frontal lobe and in the left greater  than right cerebellum. Unchanged encephalomalacia/gliosis surrounding the  right frontal shunt catheter. Moderate chronic small vessel ischemic  disease and global cerebral volume loss, unchanged compared to prior. No  extra-axial fluid collection, midline shift, or mass effect.     Unchanged position of right frontal approach ventriculoperitoneal shunt  catheter. Unchanged ventriculomegaly with an acute callosal angle and  crowding of the sulci at the vertex. Patent basal cisterns.     Scalp soft tissues are unremarkable. No osseous lesion or fracture. No  acute orbital abnormality. Status post bilateral lens replacement.  Scattered paranasal sinus mucosal disease. Mastoid air cells are clear.       Impression:           1.No acute intracranial abnormality.    2.Stable chronic findings, as described.    [address] Arne Jumbo, MD  04/02/2023 6:03 PM    Chest AP Portable [[id number]] Collected: 04/02/23 1707    Order Status: Completed Updated: 04/02/23 1711    Narrative:      INDICATION: Chest pain.     TECHNIQUE: XR CHEST AP PORTABLE     COMPARISON: Chest radiograph from 01/13/2023         FINDINGS:  Lines/tubes/devices:  There is a shunt catheter coursing along the right  neck and chest  wall.    Lungs:  No consolidation or pulmonary edema.    Heart and mediastinum:  The cardiac silhouette is enlarged.  The  mediastinal contours are within normal limits.    Pleura:  No pneumothorax. No pleural effusion.    Bones and soft tissues:  There are multiple old left rib fracture  deformities. The bony thorax is otherwise grossly unremarkable.      Impression:          No radiographic evidence of acute cardiopulmonary process.    Dinushi GORMAN Penton, MD  04/02/2023 5:09 PM             Please note: Epic EMR system/Voice recognition system and may contain inherent errors or omissions not intended by the user.         [1]   Past Surgical History:  Procedure Laterality Date    VENTRICULOPERITONEAL SHUNT  2018    shunt revision 2022   [2]   Social History  Socioeconomic History    Marital status: Widowed   Tobacco Use    Smoking status: Former     Current packs/day: 0.00     Types: Cigarettes     Quit date: 1971     Years since quitting: 54.2    Smokeless tobacco: Never   Vaping Use    Vaping status: Never Used   Substance and Sexual Activity    Alcohol  use: Not Currently    Drug use: Never    Sexual activity: Not Currently   Social History Narrative    ** Merged History Encounter **          Social Drivers of Health     Financial Resource Strain: Low Risk  (06/28/2021)    Overall Financial Resource Strain (CARDIA)     Difficulty of Paying Living Expenses: Not hard at all   Food Insecurity: No Food Insecurity (10/27/2022)    Hunger Vital Sign     Worried About Running Out of Food in the Last Year: Never true     Ran Out of Food in the Last Year: Never true   Transportation Needs: No Transportation Needs (10/27/2022)    PRAPARE - Therapist, Art (Medical): No     Lack of Transportation (Non-Medical): No   Physical Activity: Unknown (06/28/2021)    Exercise Vital Sign     Days of Exercise per Week: 0 days   Stress: Stress Concern Present (06/28/2021)    [employer] of Occupational Health -  Occupational Stress Questionnaire     Feeling of Stress : To some extent   Social Connections: Socially Isolated (06/28/2021)    Social Connection and Isolation Panel [NHANES]     Frequency of Communication with Friends and Family: Once a week     Frequency of Social Gatherings with Friends and Family: Once a week     Attends Religious Services: Never     Database Administrator or Organizations: No     Attends Banker Meetings: Never     Marital Status: Widowed   Intimate Partner Violence: Not At Risk (10/27/2022)    Humiliation, Afraid, Rape, and Kick questionnaire     Fear of Current or Ex-Partner: No     Emotionally Abused: No     Physically Abused: No     Sexually Abused: No   Housing Stability: Low Risk  (10/27/2022)    Housing Stability Vital Sign     Unable to Pay for Housing in the Last Year: No     Number of Times Moved in the Last Year: 0     Homeless in the Last Year: No   [3]   Family History  Problem Relation Name Age of Onset    Cancer Mother      Cancer Father      Hypertension Brother          Derek Marinda PARAS, [place]  04/02/23 2116

## 2023-04-03 DIAGNOSIS — N3000 Acute cystitis without hematuria: Secondary | ICD-10-CM

## 2023-04-03 LAB — BASIC METABOLIC PANEL
Anion Gap: 8 (ref 5.0–15.0)
BUN: 18 mg/dL (ref 9–28)
CO2: 22 meq/L (ref 17–29)
Calcium: 8.4 mg/dL (ref 7.9–10.2)
Chloride: 107 meq/L (ref 99–111)
Creatinine: 0.7 mg/dL (ref 0.5–1.5)
GFR: >60 mL/min/{1.73_m2} (ref >=60.0)
Glucose: 76 mg/dL (ref 70–100)
Potassium: 3.9 meq/L (ref 3.5–5.3)
Sodium: 137 meq/L (ref 135–145)

## 2023-04-03 LAB — LAB USE ONLY - CBC WITH DIFFERENTIAL
Absolute Basophils: 0.03 10*3/uL (ref 0.00–0.08)
Absolute Eosinophils: 0.22 10*3/uL (ref 0.00–0.44)
Absolute Immature Granulocytes: 0.03 10*3/uL (ref 0.00–0.07)
Absolute Lymphocytes: 1.56 10*3/uL (ref 0.42–3.22)
Absolute Monocytes: 0.6 10*3/uL (ref 0.21–0.85)
Absolute Neutrophils: 3.03 10*3/uL (ref 1.10–6.33)
Absolute nRBC: 0 10*3/uL (ref <=0.00)
Basophils %: 0.5 %
Eosinophils %: 4 %
Hematocrit: 34.9 % — ABNORMAL LOW (ref 37.6–49.6)
Hemoglobin: 11.9 g/dL — ABNORMAL LOW (ref 12.5–17.1)
Immature Granulocytes %: 0.5 %
Lymphocytes %: 28.5 %
MCH: 31.4 pg (ref 25.1–33.5)
MCHC: 34.1 g/dL (ref 31.5–35.8)
MCV: 92.1 fL (ref 78.0–96.0)
MPV: 9.8 fL (ref 8.9–12.5)
Monocytes %: 11 %
Neutrophils %: 55.5 %
Platelet Count: 126 10*3/uL — ABNORMAL LOW (ref 142–346)
Preliminary Absolute Neutrophil Count: 3.03 10*3/uL (ref 1.10–6.33)
RBC: 3.79 10*6/uL — ABNORMAL LOW (ref 4.20–5.90)
RDW: 12 % (ref 11–15)
WBC: 5.47 10*3/uL (ref 3.10–9.50)
nRBC %: 0 /100{WBCs} (ref <=0.0)

## 2023-04-03 LAB — LAB USE ONLY - URINE GRAY CULTURE HOLD TUBE

## 2023-04-03 LAB — LAB USE ONLY - GOLD SST HOLD TUBE

## 2023-04-03 LAB — LAB USE ONLY - LIGHT BLUE - CITRATE HOLD TUBE

## 2023-04-03 LAB — PHOSPHORUS: Phosphorus: 3.3 mg/dL (ref 2.3–4.7)

## 2023-04-03 LAB — MAGNESIUM: Magnesium: 1.8 mg/dL (ref 1.6–2.6)

## 2023-04-03 MED ORDER — QUETIAPINE FUMARATE 25 MG PO TABS
25.0000 mg | ORAL_TABLET | Freq: Every evening | ORAL | Status: DC
Start: 2023-04-03 — End: 2023-04-06
  Administered 2023-04-03 – 2023-04-05 (×3): 25 mg via ORAL
  Filled 2023-04-03 (×3): qty 1

## 2023-04-03 MED ORDER — CARBIDOPA-LEVODOPA ER 50-200 MG PO TBCR
2.0000 | EXTENDED_RELEASE_TABLET | Freq: Three times a day (TID) | ORAL | Status: DC
Start: 2023-04-03 — End: 2023-04-06
  Administered 2023-04-03 – 2023-04-06 (×9): 2 via ORAL
  Filled 2023-04-03 (×12): qty 2

## 2023-04-03 MED ORDER — RIVAROXABAN 10 MG PO TABS
10.0000 mg | ORAL_TABLET | Freq: Every day | ORAL | Status: DC
Start: 2023-04-03 — End: 2023-04-06
  Administered 2023-04-03 – 2023-04-05 (×3): 10 mg via ORAL
  Filled 2023-04-03 (×4): qty 1

## 2023-04-03 MED ORDER — CELECOXIB 200 MG PO CAPS
200.0000 mg | ORAL_CAPSULE | Freq: Every day | ORAL | Status: DC
Start: 2023-04-03 — End: 2023-04-04
  Administered 2023-04-03 – 2023-04-04 (×2): 200 mg via ORAL
  Filled 2023-04-03 (×2): qty 1

## 2023-04-03 MED ORDER — VENLAFAXINE HCL 37.5 MG PO TABS
75.0000 mg | ORAL_TABLET | Freq: Two times a day (BID) | ORAL | Status: DC
Start: 2023-04-03 — End: 2023-04-06
  Administered 2023-04-03 – 2023-04-06 (×6): 75 mg via ORAL
  Filled 2023-04-03 (×7): qty 2

## 2023-04-03 MED ORDER — DONEPEZIL HCL 5 MG PO TABS
10.0000 mg | ORAL_TABLET | Freq: Every evening | ORAL | Status: DC
Start: 2023-04-03 — End: 2023-04-06
  Administered 2023-04-03 – 2023-04-05 (×3): 10 mg via ORAL
  Filled 2023-04-03 (×3): qty 2

## 2023-04-03 NOTE — Progress Notes (Addendum)
 [facility]  Progress Note  Patient Info:   Date/Time: 04/03/2023 / 3:29 PM   Admit Date:04/02/2023  Patient Name:[name]   FMW:[id number]   PCP: Belenda Oddis Rasher, FNP  Attending Physician:Adi Jess Lawless, MD     Subjective:   04/03/23   Chief Complaint:  Shortness of Breath and Generalized weakness  Update of Review of Systems:  Review of Systems   Constitutional:  Positive for malaise/fatigue.   Neurological:  Positive for weakness.      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    #  Generalized weakness due to UTI:  Urinalysis large leukocytes, negative nitrites, 26-50 WBCs  Urine cultures pending  Blood cultures  Daily CBC and BMP  Gentle IV hydration given his advanced age  Influenza A and B, rapid COVID-negative in the ED.   PT/OT consults for safety and stability.-->  Recommended for SNF, will need case management involvement and daughter is supportive of SNF at discharge for safety purposes     # Paroxysmal atrial fibrillation/chronic anticoagulation on Xarelto :  History of DVT PE  His EKG is normal today.    No cardiac issues   Xarelto  resumed   This will be lifelong has he also has a history of prior VTE (DVT/PE) and factor V Leiden deficiency.     # History of right MCA CVA  -Continued on aspirin  and Xarelto     # Normal pressure hydrocephalus/VP shunt:  Initially placed 09/09/2016 with shunt revision (valve) in 2022.  He is followed periodically by neurology on an outpatient basis.     # Hypertension:  Normotensive here, continue current medications and trend BPs.     #  Hyperlipidemia:  Normal transaminases, continue statin.    # Parkinsons Disease  -Continued on Sinemet  CR while here  *Dementia with Lewy bodies  -He is on multiple psychotropic medications which have been continued including Effexor , Celexa , Seroquel  nightly, bupropion   -May benefit from palliative care consult at some point noting that he is on several serotonin modulating medications     Overweight per BMI criteria  Patient has a BMI of 26.28 kg/m2       # Disposition; awaiting urine cultures for final antimicrobial plan, will also need case management involved for disposition planning               DVT Prophylaxis:rivaroxaban  (XARELTO ) tablet 10 mg    Central Line/Foley Catheter/PICC line status:       Code Status: Full Code  Disposition:home  Type of Admission:Inpatient   Estimated Length of Stay (including stay in the ER receiving treatment): additional 24-48 hours  Milestones required for discharge:as above   Objective:     Vitals:    04/03/23 0020 04/03/23 0410 04/03/23 0837 04/03/23 1206   BP: 128/70 135/63 136/66 120/67   Pulse: 60 61 64 67   Resp: 17 16 18 17    Temp: 98.1 F (36.7 C) 97.7 F (36.5 C) 98.4 F (36.9 C) 98.4 F (36.9 C)   TempSrc: Oral Oral Oral Oral   SpO2: 94% 95% 92% 97%   Weight:       Height:  Physical Exam  Vitals and nursing note reviewed.   Constitutional:       Appearance: Normal appearance. He is normal weight.   HENT:      Head: Normocephalic.      Right Ear: External ear normal.      Left Ear: External ear normal.      Mouth/Throat:      Mouth: Mucous membranes are moist.   Eyes:      Extraocular Movements: Extraocular movements intact.   Cardiovascular:      Rate and Rhythm: Normal rate and regular rhythm.      Heart sounds: Normal heart sounds.   Pulmonary:      Breath sounds: Normal breath sounds.   Abdominal:      General: Abdomen is flat. Bowel sounds are normal.      Palpations: Abdomen is soft.   Musculoskeletal:         General: Normal range of motion.       Cervical back: Normal range of motion.   Skin:     General: Skin is warm.      Capillary Refill: Capillary refill takes less than 2 seconds.   Neurological:      General: No focal deficit present.      Mental Status: He is alert. Mental status is at baseline.   Psychiatric:         Mood and Affect: Mood normal.       Results of Labs/imaging   Labs and radiology reports have been reviewed.  Link to Dispense report for last 100 days  Link to Reconcile Home meds  Hospitalist     Signed by:   Damien CROME Missimer, NP  04/03/2023 3:29 PM    Patient seen and examined.  Agree with the assessment and plan as outlined by APP Damien, Missimer.      # Generalized weakness secondary to UTI  --History of staph UTI in January 2025  --Continue empiric Rocephin   --Obtain urine culture  --PT OT evaluation    # Lewy body dementia  --Continue outpatient medication    # Paroxysmal atrial fibrillation  # Secondary hypercoagulable state  --Resume Xarelto   --Telemetry monitoring      Jermell Holeman Gwynne Skiff, MD

## 2023-04-03 NOTE — Nursing Progress Note (Signed)
 Pt admitted to rm 226. Aox4, Vss, pt oriented to unit with call bell and phone at bedside, pt POA called to update POC, this RN called Morning side twice to review pt medication no response will pass on to Day shift nurse

## 2023-04-03 NOTE — PT Eval Note (Signed)
 Physical Therapy Evaluation  Patient: [name]    F773/F773.A  Discharge Recommendations:   Based on today's session: Discharge Recommendation: SNF     Pt demonstrates need for continued rehab after their current hospital stay. Pt demonstrates decreased activity tolerance and is not likely to be able to tolerate 3 hours of therapy/day.     If pt must return home, the patient will need home health services, 24/7 supervision, assistance with mobility, assistance with ADL's, and assistance with IADL's  DME needs if pt returns home: DME Recommended for Discharge: Patient already has needed equipment      Assessment:   [name] is a 78 y.o. male admitted 04/02/2023. Pt's presents with:  Assessment: Decreased LE strength;Decreased safety/judgement during functional mobility;Decreased endurance/activity tolerance;Impaired coordination;Impaired motor control;Decreased functional mobility;Decreased balance;Gait impairment. Pt would continue to benefit from PT to address these deficits and increase functional independence.     Therapy Diagnosis: generalized weakness, decreased functional mobility , decreased independence with ADL's, decreased endurance/ activity engagement, and disequilibrium due to current medical status. Without therapy interventions, patient is at risk for falls, dependence on caregivers for mobility, dependence on caregivers for ADL's, decreased independence, failure to return to PLOF, and decreased quality of life.    Rehabilitation Potential: Prognosis: Fair;With continued PT status post acute discharge      Plan:    Treatment/Interventions: Exercise;Gait training;Functional transfer training;LE strengthening/ROM;Endurance training;Patient/family training;Equipment eval/education;Bed mobility PT Frequency: 2-3x/wk    Risks/Benefits/POC Discussed with Pt/Family: With patient          Goals:   Goals  Goal Formulation: With patient  Time for Goal Acheivement: By time of  discharge  Goals: Select goal  Pt Will Roll Left: with supervision;to maximize functional mobility and independence;by time of discharge;Not met  Pt Will Roll Right: with supervision;to maximize functional mobility and independence;by time of discharge;Not met  Pt Will Go Supine To Sit: with supervision;to maximize functional mobility and independence;by time of discharge;Not met  Pt Will Perform Sit to Stand: with stand by assist;to maximize functional mobility and independence;by time of discharge;Not met  Pt Will Transfer Bed/Chair: with rolling walker;with stand by assist;to maximize functional mobility and independence;by time of discharge;Not met  Pt Will Ambulate: 1-10 feet;with rolling walker;with minimal assist;to maximize functional mobility and independence;by time of discharge;Not met       [facility] 24 MAIN OBSERVATION [id number]/[id number].A    PT Received On: 04/03/23  Start Time: 1228  Stop Time: 1248  Time Calculation (min): 20 min    PT Visit Number: 1    Consult received for Elna Ozell Beckford for PT Evaluation and Treatment.  Patient's medical condition is appropriate for Physical therapy intervention at this time.    Precautions and Contraindications:   Precautions  Weight Bearing Status: no restrictions  Fall Risks: High;History of fall(s);Impaired balance/gait;Impaired mobility;Muscle weakness    Medical Diagnosis: Acute cystitis without hematuria [N30.00]    History of Present Illness: [name] is a 78 y.o. male admitted on 04/02/2023 with generalized weakness due to UTI.    Per H&P:[name] is a very pleasant 78 y.o. male who is a fair historian.  History is obtained from him, the ED record and my review of his medical records in epic and Care Everywhere.  There were no family members present during my exam.  He lives at [facility] and an assisted living situation.  He has had 1 week history of generalized weakness and fatigue, no other specific  systemic complaints.  He had a couple episodes of vomiting a few days ago but none since.  Denies fevers, chills, ENT/sinus symptoms, chest pain or discomfort, palpitations, shortness of breath, cough, abdominal pain, diarrhea, constipation, UTI symptoms, recent travel or ill contacts.  He did see his PCP 3 days ago and was diagnosed with COVID but today's test is negative.  He does have a UTI but denies any complaints.  He has a history of a prior stroke with residual mild right-sided weakness and stuttering speech which is baseline for him    Problem List[1]     Past Medical/Surgical History:  Medical History[2]   Past Surgical History[3]      X-Rays/Tests/Labs:  CT Head WO Contrast  Result Date: 04/02/2023   1.No acute intracranial abnormality. 2.Stable chronic findings, as described. [name] Arne Jumbo, MD 04/02/2023 6:03 PM    Chest AP Portable  Result Date: 04/02/2023    No radiographic evidence of acute cardiopulmonary process. Loras GORMAN Penton, MD 04/02/2023 5:09 PM        Social History:  Prior Level of Function  Prior level of function: Needs assistance with ADLs, Ambulates with assistive device, Up to chair with assistance  Assistive Device: Wheelchair  Baseline Activity Level: Household ambulation (using w/c)  Ambulated 100 feet or more prior to admission: No  Driving: does not drive  Home Living Arrangements  Living Arrangements: Alone  Type of Home: Assisted living ([facility])  Home Layout: Performs ADL's on one level      Subjective:    Patient is agreeable to participation in the therapy session. Nursing clears patient for therapy.   Patient Goal  Patient Goal: nonspecific  Pain Assessment  Pain Assessment: No/denies pain       Objective:   Observation of Patient/Vital Signs:  Patient is in bed with telemetry, peripheral IV, and condom catheter in place.         Cognition/Neuro Status  Arousal/Alertness: Appropriate responses to stimuli  Attention Span: Appears intact  Orientation Level:  Oriented X4  Memory: Appears intact  Following Commands: Follows one step commands with increased time;Follows one step commands with repetition  Safety Awareness: moderate verbal instruction  Insights: Educated in safety awareness  Problem Solving: Assistance required to generate solutions;Assistance required to identify errors made;Assistance required to implement solutions  Behavior: attentive;calm;cooperative  Motor Planning: decreased processing speed;decreased initiation      Hearing: WNL  Vision: wears glasses  Sensation: WNL    Gross ROM  Right Lower Extremity ROM: within functional limits  Left Lower Extremity ROM: within functional limits  Gross Strength  Right Lower Extremity Strength: 4-/5  Left Lower Extremity Strength: 4-/5       Functional Mobility  Rolling: Minimal Assist;to Left;to Right  Supine to Sit: Moderate Assist;Increased Time;Increased Effort;HOB raised  Scooting to EOB: Dependent (via TAPS)  Sit to Supine: Maximal Assist  Sit to Stand: Moderate Assist;Increased Time;Increased Effort;with instruction for hand placement to increase safety;bed elevated (posterior lean)  Stand to Sit: Moderate Assist (poor eccentric control)  Transfers  Device Used for Functional Transfer: front-wheeled walker     Distance Walked (ft) (Step 6,7): 2 Feet  PMP Activity: Step 4 - Dangle at Bedside     Balance  Balance: needs focused assessment  Sitting - Static: Fair  Sitting - Dynamic: Fair  Standing - Static: Poor  Standing - Dynamic: Poor    Participation and Endurance  Participation Effort: good  Endurance: Tolerates 30 min exercise with multiple rests  AM-PACT Inpatient Short Forms  Inpatient AM-PACT Performed? (PT): Basic Mobility Inpatient Short Form  AM-PACT 6 Clicks Basic Mobility Inpatient Short Form  Turning Over in Bed: A little  Sitting Down On/Standing From Armchair: A lot  Lying on Back to Sitting on Side of Bed: A little  Assist Moving to/from Bed to Chair: A lot  Assist to Walk in  Hospital Room: A lot  Assist to Climb 3-5 Steps with Railing: Total  PT Basic Mobility Raw Score: 13  CMS 0-100% Score: 64.91%    Treatment Activities:   Pt provided with verbal cuing and assistance to trunk via TAPS throughout rolling and sup>sit transfers to promote skin integrity and safety with as much independence as possible. Pt provided with assistance to trunk and pelvis as they were getting upright to promote appropriate trunk positioning as well as encouraged to get feet on floor via support scooting EOB to promote improved balance and independence. Pt then assisted with sit to stand transfer with cues and support to promote pelvic and trunk alignment, appropriate BOS, balance, and safety with using RW. Pt then assisted with weight shifting, pelvic alignment, and balance to take steps safely with maxAx2. Pt took side steps to [facility], educated on hand placement on bed and to feel bed behind them to promote stability and safety with improved control and reduce fall risk. Pt then provided with maxA to trunk and BLE during sit>sup transfer.     Educated the patient to role of physical therapy, plan of care, goals of therapy and safety with mobility and ADLs, energy conservation techniques, pursed lip breathing.    Patient left up in bed with call bell in reach, all needs met, all questions answered at this time.     Therapist PPE during session procedural mask and gloves     Damien Mace, SPT         [1]   Patient Active Problem List  Diagnosis    NPH (normal pressure hydrocephalus) (CMS/HCC)    S/P VP shunt    Mixed hyperlipidemia    Primary hypertension    Paroxysmal atrial fibrillation (CMS/HCC)    Acute cystitis without hematuria    Chronic anticoagulation on Xarelto    [2]   Past Medical History:  Diagnosis Date    Abnormal cardiovascular stress test 09/19/2011    Echo/stress suggest RCA/left circumflex distribution ischemia; negative heart cath in 2001.  09/2011 cardiac catheterization: Normal coronary  arteries    Acute pulmonary embolism with acute cor pulmonale (CMS/HCC) 05/21/2018    Aphasia     Apraxia     Atrial fibrillation (CMS/HCC)     CAD in native artery     Chronic pain     Closed fracture of multiple ribs of left side with routine healing 11/03/2021    Cognitive communication deficit     CVA (cerebral vascular accident) (CMS/HCC) 07/26/2020    Factor V Leiden mutation 05/21/2018    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)     History of DVT of lower extremity     Hyperlipemia     Hypertension     Major depression, recurrent     Mild cognitive impairment 10/16/2015    Normal pressure hydrocephalus (CMS/HCC) 06/21/2016    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    Parkinson's disease (CMS/HCC)     Parkinsons (CMS/HCC)     Phlebitis and thrombophlebitis of superficial vessels of lower extremities, bilateral 09/30/2011    Pulmonary fibrosis (  CMS/HCC)     Shunt malfunction, subsequent encounter 09/04/2020    recent stroke and symptoms of shunt failure with shuntogram w/ evidence of distal obstruction, s/p elective: 09/04/2020: R frontal VP shunt revision (valve revision)    Situational mixed anxiety and depressive disorder     TIA (transient ischemic attack)     Tubular adenoma of rectum     Varicose veins of both legs with edema     Venous (peripheral) insufficiency    [3]   Past Surgical History:  Procedure Laterality Date    CARDIAC CATHETERIZATION  09/2011    Normal coronary arteries    CORONARY ANGIOPLASTY WITH STENT PLACEMENT  2017    NASAL POLYP EXCISION      PHACOEMULSIFICATION, IOL CATARACT Right 2013    Laser    VENTRICULOPERITONEAL SHUNT  09/09/2016    shunt revision 2022

## 2023-04-03 NOTE — OT Eval Note (Signed)
 Occupational Therapy Evaluation  Patient: [name] [facility]    F773/F773.A  Discharge Recommendations:   Based on today's session: SNF     If pt must return home, then the patient will need home health services, 24/7 supervision, assistance with mobility, assistance with ADL's, and assistance with IADL's  DME if pt returns home, DME Recommended for Discharge: Patient already has needed equipment      Unit: [facility] 24 MAIN OBSERVATION  Bed: M226/M226.A      Assessment:   [name] is a 78 y.o. male admitted 04/02/2023. Pt's presents with:  Assessment: decreased ROM;decreased strength;balance deficits;decreased independence with ADLs;decreased independence with IADLs;decreased endurance/activity tolerance. Pt would continue to benefit from OT to address these deficits and increase functional independence.      Therapy Diagnosis: generalized weakness, decreased functional mobility , decreased independence with ADL's, increased gait dysfunction, decreased endurance/ activity engagement, and decreased safety awareness due to current condition. Without therapy interventions, patient is at risk for falls, dependence on caregivers for mobility, dependence on caregivers for ADL's, failure to return to PLOF, and decreased quality of life.    Rehabilitation Potential: Prognosis: Good;With continued OT s/p acute discharge    Plan:   OT Frequency Recommended: 2-3x/wk   Treatment Interventions: ADL retraining;Functional transfer training;UE strengthening/ROM;Endurance training;Neuro muscular reeducation          Risks/Benefits/POC Discussed with Pt/Family: With patient    Goals:   Goal Formulation: Patient  Time For Goal Achievement: by time of discharge  ADL Goals  Patient will groom self: Supervision;at edge of bed;5 visits  Patient will dress upper body: Minimal Assist;7 visits  Mobility and Transfer Goals  Pt will perform functional transfers: Minimal Assist;with rolling walker;10  visits  Neuro Re-Ed Goals  Pt will perform dynamic sitting balance: Supervision;to increase ability to complete ADLs;7 visits                          M226/M226.A    OT Received On: 04/03/23  Start Time: 1228  Stop Time: 1248  Time Calculation (min): 20 min  OT Visit Number: 1    Consult received for [name] for OT Evaluation and Treatment.  Patient's medical condition is appropriate for Occupational therapy intervention at this time.    Precautions and Contraindications:   Precautions  Fall Risks: High;History of fall(s);Impaired balance/gait;Impaired mobility;Muscle weakness      Medical Diagnosis: Acute cystitis without hematuria [N30.00]    History of Present Illness: [name] is a 78 y.o. male admitted on 04/02/2023 with generalized weakness due to UTI.     Problem List[1]     Past Medical/Surgical History:  Medical History[2]   Past Surgical History[3]      X-Rays/Tests/Labs:  CT Head WO Contrast  Result Date: 04/02/2023   1.No acute intracranial abnormality. 2.Stable chronic findings, as described. [name] Arne Jumbo, MD 04/02/2023 6:03 PM    Chest AP Portable  Result Date: 04/02/2023    No radiographic evidence of acute cardiopulmonary process. [name] GORMAN Penton, MD 04/02/2023 5:09 PM      Social History:  Prior Level of Function  Prior level of function: Needs assistance with ADLs, Ambulates with assistive device, Up to chair with assistance  Assistive Device: Wheelchair  Baseline Activity Level: Household ambulation (using w/c)  Ambulated 100 feet or more prior to admission: No  Driving: does not drive  Home Living Arrangements  Living Arrangements: Alone  Type of Home: Assisted  living ([facility])  Home Layout: Performs ADL's on one level      Subjective:   Patient is agreeable to participation in the therapy session. Nursing clears patient for therapy. I feel weaker than usual    Pain Assessment  Pain Assessment: No/denies pain.        Objective:   Observation of  Patient/Vital Signs:  Patient is in bed with telemetry, peripheral IV, and male external catheter in place.         Cognition/Neuro Status  Arousal/Alertness: Appropriate responses to stimuli  Attention Span: Appears intact  Orientation Level: Oriented to person;Oriented to place  Memory: Decreased recall of recent events  Following Commands: moderate verbal instruction  Safety Awareness: moderate verbal instruction  Insights: Educated in safety awareness  Behavior: calm;cooperative  Motor Planning: decreased processing speed;decreased initiation    Gross ROM  Right Upper Extremity ROM: needs focused assessment  RUE ROM - Shoulder: reduced by 50%  Left Upper Extremity ROM: needs focused assessment  LUE ROM - Shoulder - % Reduced: reduced by 50%  Gross Strength  Right Upper Extremity Strength: 3/5 (elbow to distal 4/5)  Left Upper Extremity Strength: 3/5 (elbow to distal 4/5)          Sensory  Auditory: intact       Self-care and Home Management  LB Dressing: Dependent;in bed;Don/doff R sock;Don/doff L sock  Toileting: Dependent;perineal hygiene (bed level)    Mobility and Transfers  Rolling: Minimal Assist;to Left;to Right  Supine to Sit: Moderate Assist;Increased Time;Increased Effort;using bedrail;HOB raised; trunk elevation  Sit to Supine: Maximal Assist; BLE management and trunk elevation  Sit to Stand: Moderate Assist x2;bed elevated;with instruction for hand placement to increase safety; STS transfer x 1 trial from elevated EOB  Stand to Sit: Maximal Assist  Functional Mobility: Maximal Assist x2; side stepping to R toward [facility]     Balance  Static Sitting Balance: good  Dyanamic Sitting Balance: fair  Static Standing Balance: poor  Dynamic Standing Balance: poor    Participation and Endurance  Participation Effort: good  Endurance: Tolerates 10 - 20 min exercise with multiple rests    AM-PACT 6 Clicks Daily Activity Inpatient Short Form  Inpatient AM-PACT Performed?: yes  Put On/Take Off Lower Body  Clothing: Total  Assist with Bathing: A lot  Assist with Toileting: Total  Put On/Take Off Upper Body Clothing: A lot  Assist with Grooming: A little  Assist with Eating: A little  OT Daily Activity Raw Score: 12  CMS 0-100% Score: 66.57%    PMP - Progressive Mobility Protocol   PMP Activity: Step 4 - Dangle at Bedside  Distance Walked (ft) (Step 6,7): 2 Feet    Treatment Activities:     Patient seen for OT evaluation which included ADLs, functional mobility, and patient education. Patient required mod - max assistance with ADLs and functional transfers. Patient could benefit from continued OT for safe participation in ADLs, functional transfer training, decreased activity tolerance, and decreased strength. OT recommended SNF due to ADL decline, impaired strength, decreased activity tolerance for ADLs, and impaired safety awareness.     Educated the patient to role of occupational therapy, plan of care, goals of therapy and safety with mobility and ADLs.    OT left the patient in bed, alarm on, and call bell within reach. RN notified of session outcome.      Therapist PPE during session procedural mask and gloves    Carolyn Reams, OTD, OTR/L  Physical Medicine and  Rehabilitation         [1]   Patient Active Problem List  Diagnosis    NPH (normal pressure hydrocephalus) (CMS/HCC)    S/P VP shunt    Mixed hyperlipidemia    Primary hypertension    Paroxysmal atrial fibrillation (CMS/HCC)    Acute cystitis without hematuria    Chronic anticoagulation on Xarelto    [2]   Past Medical History:  Diagnosis Date    Abnormal cardiovascular stress test 09/19/2011    Echo/stress suggest RCA/left circumflex distribution ischemia; negative heart cath in 2001.  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)     History of DVT of lower extremity     Hyperlipemia     Hypertension     Major depression, recurrent     Mild cognitive impairment 10/16/2015    Normal pressure hydrocephalus (CMS/HCC) 06/21/2016    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    Parkinson's disease (CMS/HCC)     Parkinsons (CMS/HCC)     Phlebitis and thrombophlebitis of superficial vessels of lower extremities, bilateral 09/30/2011    Pulmonary fibrosis (CMS/HCC)     Shunt malfunction, subsequent encounter 09/04/2020    recent stroke and symptoms of shunt failure with shuntogram w/ evidence of distal obstruction, s/p elective: 09/04/2020: R frontal VP shunt revision (valve revision)    Situational mixed anxiety and depressive disorder     TIA (transient ischemic attack)     Tubular adenoma of rectum     Varicose veins of both legs with edema     Venous (peripheral) insufficiency    [3]   Past Surgical History:  Procedure Laterality Date    CARDIAC CATHETERIZATION  09/2011    Normal coronary arteries    CORONARY ANGIOPLASTY WITH STENT PLACEMENT  2017    NASAL POLYP EXCISION      PHACOEMULSIFICATION, IOL CATARACT Right 2013    Laser    VENTRICULOPERITONEAL SHUNT  09/09/2016    shunt revision 2022

## 2023-04-03 NOTE — Plan of Care (Signed)
 Problem: Fluid and Electrolyte Imbalance/ Endocrine  Goal: Fluid and electrolyte balance are achieved/maintained  Outcome: Progressing  Flowsheets (Taken 04/03/2023 0055)  Fluid and electrolyte balance are achieved/maintained:   Monitor/assess lab values and report abnormal values   Assess and reassess fluid and electrolyte status   Monitor for muscle weakness   Observe for cardiac arrhythmias     Problem: Compromised Activity/Mobility  Goal: Activity/Mobility Interventions  Outcome: Progressing  Flowsheets (Taken 04/03/2023 2208)  Activity/Mobility Interventions: Pad bony prominences, TAP Seated positioning system when OOB, Promote PMP, Reposition q 2 hrs / turn clock, Offload heels     Problem: Bladder/Voiding  Goal: Patient will experience proper bladder emptying during admission and remain free from infection  Outcome: Progressing  Flowsheets (Taken 04/03/2023 2208)  Patient will experience proper bladder emptying during admission and remain free from infection: Encourage bladder emptying at regular intervals

## 2023-04-03 NOTE — Progress Notes (Signed)
 Initial Case Management Assessment and Discharge Planning  [facility]   Patient Name: [name], [name]   Date of Birth Nov 22, 1945   Attending Physician: Gwynne Skiff, Colon, MD   Primary Care Physician: Relyea, Cierra Jade, FNP   Reason for Consult / Chief Complaint Pt presented to [facility] ED with initial CC of Shortness of Breath and Generalized weakness          Situation   Diagnosis:   1. Acute cystitis without hematuria        A/O Status: not oriented      Health Care Agent: Self  Name: Allean More  Phone number: [phone]     Background   Residence: One story home/apartment     Living situation: lives in a facility      Patient lives at [facility] [phone]) ALF.  Staff handles medications ([facility] [phone]).  He is mostly wheelchair bound and requires a two person assist.  He has previously used [facility] for skilled home health services.    PCP: Oddis Vermell Familia, FNP    Patient Contact:   [phone] (home)     [phone] (mobile)     Emergency contact:   Extended Emergency Contact Information  Primary Emergency Contact: More,Kristin  Mobile Phone: [phone]  Relation: Daughter  Preferred language: English  Secondary Emergency Contact: [facility] Phone: [phone]  Relation: Son in law  Preferred language: English     Support system:  family    ADL's: Requires moderate assistance    Previous Level of function: 3 Moderate Assist    DME:  wheelchair, walker    Pharmacy:    [facility] - [place] - [place], [place] - [address]  [address]  [place] [address]  Phone: [phone] Fax: [phone]       Prescription Coverage: Yes    Home Health: The patient is not currently receiving home health services.    Previous SNF/AR:  [facility]    Date First IMM given:    UAI on file?: No         Assessment     Here with weakness related to UTI.  Paroxysmal atrial fibrillation (Xarelto ).  Normal pressure hydrocephalus / VP shunt.  Hypertension.   SOB.      Assessment completed at bedside with Patient       Recommendation   D/C Plan A: Assisted Living Facility    D/C Plan B: SNF    Transport for discharge? Mode of transportation: Private Vehicle - Family/Friend to drive patient    Final dispo pending PT/OT eval and medical clearance.  CM explained case management role.  Going to have to speak with family when they are available.      CM to follow.

## 2023-04-03 NOTE — Plan of Care (Signed)
 Problem: Moderate/High Fall Risk Score >5  Goal: Patient will remain free of falls  Outcome: Progressing  Flowsheets (Taken 04/03/2023 0055)  VH High Risk (Greater than 13):   ALL REQUIRED LOW INTERVENTIONS   ALL REQUIRED MODERATE INTERVENTIONS   PATIENT IS TO BE SUPERVISED FOR ALL TOILETING ACTIVITIES   Keep door open for better visibility     Problem: Fluid and Electrolyte Imbalance/ Endocrine  Goal: Fluid and electrolyte balance are achieved/maintained  Outcome: Progressing  Flowsheets (Taken 04/03/2023 0055)  Fluid and electrolyte balance are achieved/maintained:   Monitor/assess lab values and report abnormal values   Assess and reassess fluid and electrolyte status   Monitor for muscle weakness   Observe for cardiac arrhythmias  Goal: Adequate hydration  Outcome: Progressing  Flowsheets (Taken 04/03/2023 0055)  Adequate hydration:   Monitor and assess vital signs and perfusion   Assess for peripheral, sacral, periorbital and abdominal edema   Assess mucus membranes, skin color, turgor, perfusion and presence of edema

## 2023-04-03 NOTE — Nursing Progress Note (Signed)
 RN Shift Note and Trio Rounds template  Date Time: 04/03/23 6:58 AM  Patient Name: [name], [name]  Room No: F773/F773.A  Admit Date: 04/02/2023  Length of Stay: 0  Attending Physician: Gwynne Skiff, Colon, MD   Code Status:Full Code  Admit Status: Observation  Shift Note:  Shift Events/ Updates: n/a  Plan:   -Iv fluids  - iv antibiotics  - pt/ot    Trio Rounds Template:   04/03/23    Shift Events/ Updates: (Call to Physician, RRT, new symptoms, Changes to exam per RN)  Overnight Events: No overnight events     Orientation:Change in Mental Status and ADL's         Assessment:     Vital Signs: (abnormal and pain is the 5th vital sign)      Pain/VS: 0-No pain    Weight: (Trending, I&O's, Net, diuresing)                                       Respiratory: (increase or decreased O2 demands, O2 needed at home)      Cardiac Rate & Rhythm     Telemetry necessity (Short-Term/ Long-Term)      GI: Bowel concerns    GU: Bladder/urine concerns  Does Patient have Foley Catheter (anticipated removal date)        Diet:      Most Recent diet: Diet regular  Previous diet: Diet regular    Patient has abnormal labs (K, Mg, hb, Cr, LFT's, Cultures resulted in last 24 hours)       Patient had Imaging resulted in 24 hours   CT Head WO Contrast  Result Date: 04/02/2023   1.No acute intracranial abnormality. 2.Stable chronic findings, as described. Milan Arne Jumbo, MD 04/02/2023 6:03 PM    Chest AP Portable  Result Date: 04/02/2023    No radiographic evidence of acute cardiopulmonary process. Loras GORMAN Penton, MD 04/02/2023 5:09 PM      Antibiotics: CEFTRIAXONE  1 G IVP (+DILUENT )  CEFTRIAXONE  1 GM MBP    Anticoagulants:      Mobility:     PT/OT, sitters            Yes  Yes     Psychosocial:           Psychosocial:     Patient/family have questions on plan of care/ discharge plans  No concerns/questions on plan of care     Medication history updated     Dispense report 100 days   Unit Protocols:     Patient has Central Line   (Anticipated removal date)                                          Fall risk                                                                         Patient has Pressure Ulcer or at Risk  Glucose less than 100 and on insulin                          Readmission in last 30 days        Patient transferred last night from another unit                  6:58 AM   04/03/23

## 2023-04-03 NOTE — Plan of Care (Signed)
 Problem: Moderate/High Fall Risk Score >5  Goal: Patient will remain free of falls  Outcome: Progressing  Flowsheets (Taken 04/03/2023 0900)  High (Greater than 13):   HIGH-Consider use of low bed   HIGH-Pharmacy to initiate evaluation and intervention per protocol   HIGH-Initiate use of floor mats as appropriate   HIGH-Apply yellow Fall Risk arm band   HIGH-Utilize chair pad alarm for patient while in the chair   HIGH-Bed alarm on at all times while patient in bed   HIGH-Visual cue at entrance to patient's room     Problem: Fluid and Electrolyte Imbalance/ Endocrine  Goal: Fluid and electrolyte balance are achieved/maintained  Outcome: Progressing  Flowsheets (Taken 04/03/2023 0055 by London Grate, RN)  Fluid and electrolyte balance are achieved/maintained:   Monitor/assess lab values and report abnormal values   Assess and reassess fluid and electrolyte status   Monitor for muscle weakness   Observe for cardiac arrhythmias  Goal: Adequate hydration  Outcome: Progressing  Flowsheets (Taken 04/03/2023 0055 by London Grate, RN)  Adequate hydration:   Monitor and assess vital signs and perfusion   Assess for peripheral, sacral, periorbital and abdominal edema   Assess mucus membranes, skin color, turgor, perfusion and presence of edema     Problem: Bladder/Voiding  Goal: Patient will experience proper bladder emptying during admission and remain free from infection  Outcome: Progressing  Flowsheets (Taken 04/03/2023 1130)  Patient will experience proper bladder emptying during admission and remain free from infection:   Apply urinary containment device as appropriate and/or per order   Encourage patient to identify medications that aid bladder function prior to administration   Encourage bladder emptying at regular intervals

## 2023-04-03 NOTE — Transition of Care (Signed)
 4 eyes in 4 hours pressure injury assessment note:      Completed with: Mavis Tech  Unit & Time admitted: 24 main @2300            Bony Prominences: Check appropriate box; if wound is present enter wound assessment in LDA     Occiput:                 [x] WNL  []  Wound present  Face:                     [x] WNL  []  Wound present  Ears:                      [x] WNL  []  Wound present  Spine:                    [x] WNL  []  Wound present  Shoulders:             [x] WNL  []  Wound present  Elbows:                  [x] WNL  []  Wound present  Sacrum/coccyx:     [x] WNL  []  Wound present  Ischial Tuberosity:  [x] WNL  []  Wound present  Trochanter/Hip:      [x] WNL  []  Wound present  Knees:                   [x] WNL  []  Wound present  Ankles:                   [x] WNL  []  Wound present  Heels:                    [x] WNL  []  Wound present  Other pressure areas:  []  Wound location       Device related: []  Device name:         LDA completed if wound present: yes/no  Consult WOCN if necessary    Other skin related issues, ie tears, rash, etc, document in Integumentary flowsheet

## 2023-04-04 LAB — BASIC METABOLIC PANEL
Anion Gap: 8 (ref 5.0–15.0)
BUN: 22 mg/dL (ref 9–28)
CO2: 22 meq/L (ref 17–29)
Calcium: 8.2 mg/dL (ref 7.9–10.2)
Chloride: 108 meq/L (ref 99–111)
Creatinine: 0.7 mg/dL (ref 0.5–1.5)
GFR: >60 mL/min/{1.73_m2} (ref >=60.0)
Glucose: 81 mg/dL (ref 70–100)
Potassium: 3.9 meq/L (ref 3.5–5.3)
Sodium: 138 meq/L (ref 135–145)

## 2023-04-04 LAB — WHOLE BLOOD GLUCOSE POCT: Whole Blood Glucose POCT: 98 mg/dL (ref 70–100)

## 2023-04-04 LAB — ECG 12-LEAD
Atrial Rate: 67 {beats}/min
P Axis: 24 degrees
P-R Interval: 162 ms
Q-T Interval: 472 ms
QRS Duration: 100 ms
QTC Calculation (Bezet): 486 ms
R Axis: -14 degrees
T Axis: -16 degrees
Ventricular Rate: 67 {beats}/min

## 2023-04-04 LAB — LAB USE ONLY - CBC WITH DIFFERENTIAL
Absolute Basophils: 0.04 10*3/uL (ref 0.00–0.08)
Absolute Eosinophils: 0.23 10*3/uL (ref 0.00–0.44)
Absolute Immature Granulocytes: 0.03 10*3/uL (ref 0.00–0.07)
Absolute Lymphocytes: 1.68 10*3/uL (ref 0.42–3.22)
Absolute Monocytes: 0.71 10*3/uL (ref 0.21–0.85)
Absolute Neutrophils: 3.55 10*3/uL (ref 1.10–6.33)
Absolute nRBC: 0 10*3/uL (ref <=0.00)
Basophils %: 0.6 %
Eosinophils %: 3.7 %
Hematocrit: 31.7 % — ABNORMAL LOW (ref 37.6–49.6)
Hemoglobin: 10.9 g/dL — ABNORMAL LOW (ref 12.5–17.1)
Immature Granulocytes %: 0.5 %
Lymphocytes %: 26.9 %
MCH: 31.6 pg (ref 25.1–33.5)
MCHC: 34.4 g/dL (ref 31.5–35.8)
MCV: 91.9 fL (ref 78.0–96.0)
MPV: 9.7 fL (ref 8.9–12.5)
Monocytes %: 11.4 %
Neutrophils %: 56.9 %
Platelet Count: 133 10*3/uL — ABNORMAL LOW (ref 142–346)
Preliminary Absolute Neutrophil Count: 3.55 10*3/uL (ref 1.10–6.33)
RBC: 3.45 10*6/uL — ABNORMAL LOW (ref 4.20–5.90)
RDW: 12 % (ref 11–15)
WBC: 6.24 10*3/uL (ref 3.10–9.50)
nRBC %: 0 /100{WBCs} (ref <=0.0)

## 2023-04-04 LAB — CULTURE, URINE

## 2023-04-04 NOTE — Progress Notes (Signed)
 CM Progress note:     D/C Disposition: SNF vs. Back to [facility] with [facility]  Anticipated D/C Date: 04/06/23    Patient now with anemia needing monitoring of H/H per MD and not ready for discharge today. Received call from Suzen Reef (with [facility]) who is assisting patient's daughter with plan of care. They would like patient to discharge to a SNF. PT.OT recommend SNF. Referrals for SNF placed in [facility]. Patient was in observation status and placed in-patient status yesterday. Will not have 3 midnights for SNF until Thursday. Disposition plan is to SNF vs. Return to [facility] w/ home health services. Transport TBD closer to time of discharge.     Deland Motto RN Case Manager  [facility]  [phone]

## 2023-04-04 NOTE — Plan of Care (Signed)
 Problem: Fluid and Electrolyte Imbalance/ Endocrine  Goal: Fluid and electrolyte balance are achieved/maintained  Outcome: Progressing  Flowsheets (Taken 04/03/2023 0055)  Fluid and electrolyte balance are achieved/maintained:   Monitor/assess lab values and report abnormal values   Assess and reassess fluid and electrolyte status   Monitor for muscle weakness   Observe for cardiac arrhythmias     Problem: Compromised Sensory Perception  Goal: Sensory Perception Interventions  Outcome: Progressing  Flowsheets (Taken 04/04/2023 2238)  Sensory Perception Interventions: Offload heels, Pad bony prominences, Reposition q 2hrs/turn Clock, Q2 hour skin assessment under devices if present     Problem: Compromised Activity/Mobility  Goal: Activity/Mobility Interventions  Outcome: Progressing  Flowsheets (Taken 04/04/2023 2238)  Activity/Mobility Interventions: Pad bony prominences, TAP Seated positioning system when OOB, Promote PMP, Reposition q 2 hrs / turn clock, Offload heels

## 2023-04-04 NOTE — Progress Notes (Addendum)
 RN Shift Note and Trio Rounds template  Date Time: 04/04/23 5:11 PM  Patient Name: [name], [name]  Room No: F773/F773.A  Admit Date: 04/02/2023  Length of Stay: 1  Attending Physician: Gwynne Skiff, Colon, MD   Code Status:Full Code  Admit Status: Inpatient  Shift Note:  Shift Events/ Updates:   Plan: Monitor H & H            PT/OT            Continue Xarelto             Stool occult, Iron profile & Ferritin           Possible Ken Caryl to SNIF                               Trio Rounds Template:   04/04/23    Shift Events/ Updates: (Call to Physician, RRT, new symptoms, Changes to exam per RN)  Overnight Events: No overnight events     Orientation:Change in Mental Status and ADL's      AXO2 to 3   Assessment:     Vital Signs: (abnormal and pain is the 5th vital sign)      Pain/VS: 0-No pain    Weight: (Trending, I&O's, Net, diuresing)                                       Respiratory: (increase or decreased O2 demands, O2 needed at home)      Cardiac Rate & Rhythm     Telemetry necessity (Short-Term/ Long-Term)      GI: Bowel concerns    GU: Bladder/urine concerns  Does Patient have Foley Catheter (anticipated removal date)        Diet:      Most Recent diet: Adult diet Regular  Previous diet: Diet regular    Patient has abnormal labs (K, Mg, hb, Cr, LFT's, Cultures resulted in last 24 hours)       Patient had Imaging resulted in 24 hours   No results found.    Antibiotics: CEFTRIAXONE  1 G IVP (+DILUENT )  CEFTRIAXONE  1 GM MBP    Anticoagulants: Yes    Mobility:     PT/OT, sitters            Yes  Yes     Psychosocial:           Psychosocial:  WDL   Patient/family have questions on plan of care/ discharge plans  No concerns/questions on plan of care     Medication history updated     Dispense report 100 days   Unit Protocols:     Patient has [employer]  (Anticipated removal date)                                          Fall risk                                                                      High  Patient  has Pressure Ulcer or at Risk                           Glucose less than 100 and on insulin                          Readmission in last 30 days        Patient transferred last night from another unit                  5:11 PM   04/04/23

## 2023-04-04 NOTE — Plan of Care (Signed)
 Problem: Moderate/High Fall Risk Score >5  Goal: Patient will remain free of falls  Outcome: Progressing  Flowsheets  Taken 04/04/2023 0900 by Cornelia Dragon, RN  High (Greater than 13):   HIGH-Bed alarm on at all times while patient in bed   HIGH-Apply yellow Fall Risk arm band   HIGH-Pharmacy to initiate evaluation and intervention per protocol   HIGH-Utilize chair pad alarm for patient while in the chair  Taken 04/03/2023 0055 by London Grate, RN  VH High Risk (Greater than 13):   ALL REQUIRED LOW INTERVENTIONS   ALL REQUIRED MODERATE INTERVENTIONS   PATIENT IS TO BE SUPERVISED FOR ALL TOILETING ACTIVITIES   Keep door open for better visibility     Problem: Fluid and Electrolyte Imbalance/ Endocrine  Goal: Fluid and electrolyte balance are achieved/maintained  Outcome: Progressing  Flowsheets (Taken 04/03/2023 0055 by London Grate, RN)  Fluid and electrolyte balance are achieved/maintained:   Monitor/assess lab values and report abnormal values   Assess and reassess fluid and electrolyte status   Monitor for muscle weakness   Observe for cardiac arrhythmias  Goal: Adequate hydration  Outcome: Progressing  Flowsheets (Taken 04/03/2023 0055 by London Grate, RN)  Adequate hydration:   Monitor and assess vital signs and perfusion   Assess for peripheral, sacral, periorbital and abdominal edema   Assess mucus membranes, skin color, turgor, perfusion and presence of edema     Problem: Compromised Sensory Perception  Goal: Sensory Perception Interventions  Outcome: Progressing  Flowsheets (Taken 04/04/2023 0900)  Sensory Perception Interventions: Offload heels, Pad bony prominences, Reposition q 2hrs/turn Clock, Q2 hour skin assessment under devices if present     Problem: Compromised Moisture  Goal: Moisture level Interventions  Outcome: Progressing  Flowsheets (Taken 04/04/2023 0900)  Moisture level Interventions: Moisture wicking products, Moisture barrier cream     Problem: Compromised  Activity/Mobility  Goal: Activity/Mobility Interventions  Outcome: Progressing  Flowsheets (Taken 04/04/2023 0900)  Activity/Mobility Interventions: Pad bony prominences, TAP Seated positioning system when OOB, Promote PMP, Reposition q 2 hrs / turn clock, Offload heels     Problem: Compromised Nutrition  Goal: Nutrition Interventions  Outcome: Progressing  Flowsheets (Taken 04/04/2023 0900)  Nutrition Interventions: Discuss nutrition at Rounds, I&Os, Document % meal eaten, Daily weights     Problem: Compromised Friction/Shear  Goal: Friction and Shear Interventions  Outcome: Progressing  Flowsheets (Taken 04/04/2023 0900)  Friction and Shear Interventions: Pad bony prominences, Off load heels, HOB 30 degrees or less unless contraindicated, Consider: TAP seated positioning, Heel foams     Problem: Bladder/Voiding  Goal: Patient will experience proper bladder emptying during admission and remain free from infection  Outcome: Progressing  Flowsheets (Taken 04/03/2023 2208 by London Grate, RN)  Patient will experience proper bladder emptying during admission and remain free from infection: Encourage bladder emptying at regular intervals

## 2023-04-04 NOTE — Progress Notes (Signed)
 [facility]  Progress Note  Patient Info:   Date/Time: 04/04/2023 / 2:15 PM   Admit Date:04/02/2023  Patient Name:[name]   FMW:[id number]   PCP: Belenda Oddis Rasher, FNP  Attending Physician:Adi Jess Lawless, MD     Subjective:   04/04/23   Chief Complaint:  Shortness of Breath and Generalized weakness  Update of Review of Systems:  Review of Systems   Constitutional:  Positive for malaise/fatigue.   Neurological:  Positive for weakness.      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    #  Generalized weakness   -- Unclear etiology, probably due to underlying Parkinson's disease  --Although UA was positive, urine culture is negative  --Will discontinue antibiotics  Influenza A and B, rapid COVID-negative in the ED.   Status post gentle IV fluids  PT OT recommends SNF  Discussed with case management about SNF placement    # Acute normocytic anemia  --No evidence of overt GI bleed  --Will obtain stool occult, Iron profile and Ferritin  --Monitor H/H  --[place] Celebrex   --Continue PPI     # Paroxysmal atrial fibrillation  # Secondary hypercoagulable state  #History of DVT PE  # Factor V Leiden deficiency  Continue Xarelto  for now with close monitoring of H&H    # History of right MCA CVA  -Continued on aspirin  and Xarelto     # Normal pressure hydrocephalus/VP shunt:  Initially placed 09/09/2016 with shunt revision (valve) in 2022.  He is followed periodically by neurology on an outpatient basis.     # Hypertension:  Normotensive here, continue current medications and trend BPs.     #  Hyperlipidemia:  Normal transaminases, continue statin.    # Parkinsons Disease  # Lewy body dementia   --Continue Sinemet   -He is on multiple psychotropic medications which have been continued including Effexor , Celexa , Seroquel  nightly, bupropion   -May benefit from palliative care consult at some point noting that he is on several serotonin modulating medications     Overweight per BMI criteria  Patient has a BMI of 26.28 kg/m2                  DVT Prophylaxis:rivaroxaban  (XARELTO ) tablet 10 mg    Central Line/Foley Catheter/PICC line status:       Code Status: Full Code  Disposition:SNF  Type of Admission:Inpatient   Estimated Length of Stay (including stay in the ER receiving treatment): >2 MN  Milestones required for discharge:as above   Objective:     Vitals:    04/04/23 0342 04/04/23 0754 04/04/23 0827 04/04/23 1151   BP: 109/64 124/62 145/69 122/68   Pulse: 63 68 70 66   Resp: 17 16 17 17    Temp: 97.9 F (36.6 C) 97.7 F (36.5 C) 97.5 F (36.4 C) 97.3 F (36.3 C)   TempSrc: Oral Oral Oral Oral   SpO2: 95% 95% 93% 93%   Weight:       Height:           Physical Exam  Vitals and nursing note reviewed.   Constitutional:       Appearance: Normal appearance. He is normal  weight.   HENT:      Head: Normocephalic.      Right Ear: External ear normal.      Left Ear: External ear normal.      Mouth/Throat:      Mouth: Mucous membranes are moist.   Eyes:      Extraocular Movements: Extraocular movements intact.   Cardiovascular:      Rate and Rhythm: Normal rate and regular rhythm.      Heart sounds: Normal heart sounds.   Pulmonary:      Breath sounds: Normal breath sounds.   Abdominal:      General: Abdomen is flat. Bowel sounds are normal.      Palpations: Abdomen is soft.   Musculoskeletal:         General: Normal range of motion.      Cervical back: Normal range of motion.   Skin:     General: Skin is warm.      Capillary Refill: Capillary refill takes less than 2 seconds.   Neurological:      General: No  focal deficit present.      Mental Status: He is alert. Mental status is at baseline.   Psychiatric:         Mood and Affect: Mood normal.       Results of Labs/imaging   Labs and radiology reports have been reviewed.  Link to Dispense report for last 100 days  Link to Reconcile Home meds  Hospitalist     Signed by:   Colon Gwynne Skiff, MD  04/04/2023 2:15 PM

## 2023-04-05 LAB — LAB USE ONLY - CBC WITH DIFFERENTIAL
Absolute Basophils: 0.03 10*3/uL (ref 0.00–0.08)
Absolute Eosinophils: 0.12 10*3/uL (ref 0.00–0.44)
Absolute Immature Granulocytes: 0.02 10*3/uL (ref 0.00–0.07)
Absolute Lymphocytes: 0.99 10*3/uL (ref 0.42–3.22)
Absolute Monocytes: 0.47 10*3/uL (ref 0.21–0.85)
Absolute Neutrophils: 3.54 10*3/uL (ref 1.10–6.33)
Absolute nRBC: 0 10*3/uL (ref <=0.00)
Basophils %: 0.6 %
Eosinophils %: 2.3 %
Hematocrit: 32.7 % — ABNORMAL LOW (ref 37.6–49.6)
Hemoglobin: 11.1 g/dL — ABNORMAL LOW (ref 12.5–17.1)
Immature Granulocytes %: 0.4 %
Lymphocytes %: 19.1 %
MCH: 31.4 pg (ref 25.1–33.5)
MCHC: 33.9 g/dL (ref 31.5–35.8)
MCV: 92.6 fL (ref 78.0–96.0)
MPV: 10.2 fL (ref 8.9–12.5)
Monocytes %: 9.1 %
Neutrophils %: 68.5 %
Platelet Count: 144 10*3/uL (ref 142–346)
Preliminary Absolute Neutrophil Count: 3.54 10*3/uL (ref 1.10–6.33)
RBC: 3.53 10*6/uL — ABNORMAL LOW (ref 4.20–5.90)
RDW: 12 % (ref 11–15)
WBC: 5.17 10*3/uL (ref 3.10–9.50)
nRBC %: 0 /100{WBCs} (ref <=0.0)

## 2023-04-05 LAB — BASIC METABOLIC PANEL
Anion Gap: 7 (ref 5.0–15.0)
BUN: 16 mg/dL (ref 9–28)
CO2: 24 meq/L (ref 17–29)
Calcium: 8.4 mg/dL (ref 7.9–10.2)
Chloride: 107 meq/L (ref 99–111)
Creatinine: 0.7 mg/dL (ref 0.5–1.5)
GFR: >60 mL/min/{1.73_m2} (ref >=60.0)
Glucose: 91 mg/dL (ref 70–100)
Potassium: 4.2 meq/L (ref 3.5–5.3)
Sodium: 138 meq/L (ref 135–145)

## 2023-04-05 LAB — IRON PROFILE
Iron Saturation: 17 % (ref 15–50)
Iron: 40 ug/dL — ABNORMAL LOW (ref 41–168)
TIBC: 231 ug/dL — ABNORMAL LOW (ref 261–462)
UIBC: 191 ug/dL (ref 126–382)

## 2023-04-05 LAB — STOOL OCCULT BLOOD SINGLE SPECIMEN: Stool Occult Blood Single Specimen: NEGATIVE

## 2023-04-05 LAB — FERRITIN: Ferritin: 132 ng/mL (ref 21.80–274.70)

## 2023-04-05 NOTE — Nursing Progress Note (Signed)
 RN Shift Note and Trio Rounds template  Date Time: 04/05/23 7:11 AM  Patient Name: [name], [name]  Room No: F773/F773.A  Admit Date: 04/02/2023  Length of Stay: 2  Attending Physician: Gwynne Skiff, Colon, MD   Code Status:Full Code  Admit Status: Inpatient  Shift Note:  Shift Events/ Updates: pt had 2 episodes of vomiting early this morioing  Plan:   Monitor H & H            PT/OT            Continue Xarelto             Stool occult negative           Possible [place] to SNIF               Trio Rounds Template:   04/05/23    Shift Events/ Updates: (Call to Physician, RRT, new symptoms, Changes to exam per RN)  Overnight Events: No overnight events     Orientation:Change in Mental Status and ADL's         Assessment:     Vital Signs: (abnormal and pain is the 5th vital sign) SpO2: 94    Pain/VS: 0-No pain    Weight: (Trending, I&O's, Net, diuresing)                                       Respiratory: (increase or decreased O2 demands, O2 needed at home)      Cardiac Rate & Rhythm     Telemetry necessity (Short-Term/ Long-Term)      GI: Bowel concerns    GU: Bladder/urine concerns  Does Patient have Foley Catheter (anticipated removal date)        Diet:      Most Recent diet: Adult diet Regular  Previous diet: Diet regular    Patient has abnormal labs (K, Mg, hb, Cr, LFT's, Cultures resulted in last 24 hours)       Patient had Imaging resulted in 24 hours   No results found.    Antibiotics: CEFTRIAXONE  1 G IVP (+DILUENT )  CEFTRIAXONE  1 GM MBP    Anticoagulants: Yes    Mobility:     PT/OT, sitters            Yes  Yes     Psychosocial:           Psychosocial:     Patient/family have questions on plan of care/ discharge plans  No concerns/questions on plan of care     Medication history updated     Dispense report 100 days   Unit Protocols:     Patient has [employer]  (Anticipated removal date)                                          Fall risk                                                                          Patient has Pressure Ulcer or at Risk  Glucose less than 100 and on insulin                          Readmission in last 30 days        Patient transferred last night from another unit                  7:11 AM   04/05/23

## 2023-04-05 NOTE — Progress Notes (Signed)
 CM Progress note:     D/C Disposition: SNF  Anticipated D/C Date: 04/06/23    Spoke with patient's family/ care manager who state their first choice for SNF is [facility] as patient has been there in the past and it's closer to family and the 2nd choice is [facility]. Reached out to [facility] to see if they will have a bed for patient tomorrow and am waiting for their answer. [facility] is able to accept patient tomorrow but state that may change as their bed openings may change. CM to follow up tomorrow for bed availability.     Deland Motto RN Case Manager  [facility]  [phone]

## 2023-04-05 NOTE — Progress Notes (Addendum)
 [facility]  Internal Medicine Hospitalists  Progress Note        Assessment / Plan:        [name] is a 78 y.o. 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       #  Generalized weakness   -- Unclear etiology, probably due to underlying Parkinson's disease  --Although UA was positive, urine culture is negative-UTI ruled out  -- Antibiotic discontinued  --Influenza A and B, rapid COVID-negative in the ED.   --Status post gentle IV fluids  --PT OT recommends SNF  --Plan for discharge to SNF in a.m.     # Acute normocytic anemia  --No evidence of overt GI bleed  --Stool occult neck  --On Xarelto  and aspirin   --Celebrex  discontinued  --Continue PPI  --H&H stable  --Will recheck CBC in a.m.     # Paroxysmal atrial fibrillation  # Secondary hypercoagulable state  #History of DVT PE  # Factor V Leiden deficiency  Continue Xarelto  for now with close monitoring of H&H     # History of right MCA CVA  -Continued on aspirin  and Xarelto      # Normal pressure hydrocephalus/VP shunt:  Initially placed 09/09/2016 with shunt revision (valve) in 2022.  He is followed periodically by neurology on an outpatient basis.     # Hypertension:  Normotensive here, continue current medications and trend BPs.     # Hyperlipidemia:  Normal transaminases, continue statin.     # Parkinsons Disease  # Lewy body dementia   --Continue Sinemet   -He is on multiple psychotropic medications which have been continued including Effexor , Celexa , Seroquel  nightly, bupropion   -May benefit from palliative care consult at some point noting that he is on several serotonin modulating medications       Discussed  the plan with patient's daughter at bedside       VTE Prophylaxis: rivaroxaban  (XARELTO ) tablet 10 mg  Place sequential compression device     Foley Catheter: No Foley Present    Venous Access: No Temporary Central Line Present    Medical Readiness for Discharge: Anticipated Tomorrow    Open Handoff Activity in Sidebar    Additional Diagnoses:     Overweight per BMI criteria  Patient has a BMI of 26.28 kg/m2             Subjective:      Reports feeling very weak         Objective:      Temp:  [97.7 F (36.5 C)-98.2 F (36.8 C)] 97.9 F (36.6 C)  Heart Rate:  [61-73] 68  Resp Rate:  [15-19] 19  BP: (101-128)/(55-67) 118/65   General: awake, alert, oriented x 3; weak  HEENT: anicteric sclerae, PERRL, EOMI; MMM  Cardiovascular: regular rate and rhythm; no murmurs, rubs, or gallops  Lungs: clear to auscultation bilaterally without wheezing, rhonchi, or rales  Abdomen: soft, non-distended; non-tender to palpation, no rebound or guarding  Extremities: warm; no LE edema; no clubbing or cyanosis  Neuro: symmetric facial movements, clear speech, moving all extremities          Valera V[place] Gwynne Skiff, MD

## 2023-04-05 NOTE — PT Progress Note (Addendum)
 Physical Therapy Treatment  Patient: [name]    F773/F773.A  Discharge Recommendations:   Based on today's session: SNF   If SNF is not available the patient will need home health services and increased supervision and assistance at his ALF.   If pt returns home, DME Recommended for Discharge: Patient already has needed equipment      Unit: [facility] 24 MAIN OBSERVATION     PT Received On: 04/05/23  Start Time: 0939  Stop Time: 1005  Time Calculation: 26 min  PT Visit Number: 2    Patient's medical condition is appropriate for Physical Therapy intervention at this time.    Precautions:  Bleeding, Hx PD and  CVA with R hemi       Assessment:  Slow progress with activity tolerance.  Pt will benefit from continued in patient rehab to address the following deficits:  Impaired coordination;Impaired motor control;Decreased functional mobility;Decreased balance;Decreased UE strength;Decreased LE strength;Gait impairment   Prognosis: Fair;With continued PT status post acute discharge   Progress: Progressing toward goals   Patient Goal: nonspecific    Goals  Pt Will Roll Left: with supervision;to maximize functional mobility and independence;by time of discharge;Not met  Pt Will Roll Right: with supervision;to maximize functional mobility and independence;by time of discharge;Not met  Pt Will Go Supine To Sit: with moderate assist;to maximize functional mobility and independence;by time of discharge;Not met  Pt Will Perform Sit to Stand: with minimum assist;to maximize functional mobility and independence;by time of discharge;Not met  Pt Will Transfer Bed/Chair: with rolling walker;with minimum assist;to maximize functional mobility and independence;by time of discharge;Not met  Pt Will Ambulate: 1-10 feet;with rolling walker;with minimal assist;to maximize functional mobility and independence;by time of discharge;Not met       Plan: Continue with Physical Therapy services to address  neuromuscular, balance, and mobility deficits.    Treatment/Interventions: Exercise;Gait training;Functional transfer training;LE strengthening/ROM;Endurance training;Patient/family training;Equipment eval/education;Bed mobility   PT Frequency: 2-3x/wk       Subjective: Patient is agreeable to participation in the therapy session, reports that at his ALF he has one person that helps him get up out of bed, and one person that helps him dress.  Daughter arrived during session, voiced concern as family has noticed patient does not seem to have enough assistance currently at ALF.  Nursing clears patient for therapy.   Pain Assessment: No/denies pain        Objective:  Observation of Patient/Vital Signs: Reviewed patients chart and labs.  Vital signs being monitored by nursing staff and appear to be within acceptable ranges and therapeutic parameters for Physical Therapy to proceed.  Patient is in bed with telemetry, peripheral IV access site, and external catheter in place.  Patient seen for functional activities and exercises as noted:      Cognition/Neuro Status  Arousal/Alertness: Appropriate responses to stimuli  Attention Span: Appears intact  Orientation Level: Oriented X4  Memory: Appears intact  Following Commands: Follows one step commands with increased time and repetition  Safety Awareness: moderate verbal instruction  Insights: Educated in safety awareness  Problem Solving: Moderate assistance   Behavior: attentive;calm;cooperative;flat affect  Motor Planning: decreased processing speed;decreased initiation    Functional Mobility  Supine to Sit: Maximal Assist;to Right;HOB raised;Increased Time;Increased Effort  Scooting to EOB: Moderate Assist  Sit to Stand: Moderate Assist;Increased Time;Increased Effort;with instruction for hand placement to increase safety;bed elevated; Posterior lean initially upon standing from EOB)  Stand to Sit: Moderate Assist  Functional Mobility Deferred:  ambulation    Transfers  Bed to Chair: Maximal Assist;to Right  Stand Pivot Transfers: Moderate Assist  Device Used for Functional Transfer: front-wheeled walker    LE Therapeutic Exercise  Ankle Pumps:  B x 10-12   LAQ:  B x 10  Hip Adduction/Abduction: B x 5  Instructed in proper pacing and performance of LE there ex to maximize quality of each exercise.  Encouraged to perform LE therex throughout the day to decrease effects of immobility. Will require reinforcement.        Neuro Re-Ed  Sitting Balance: minimal assist;stand by assist;without support  Standing Balance: moderate assist;with instruction     AM-PACT 6 Clicks Basic Mobility Inpatient Short Form  Turning Over in Bed: A lot  Sitting Down On/Standing From Armchair: A lot  Lying on Back to Sitting on Side of Bed: A lot  Assist Moving to/from Bed to Chair: A little  Assist to Walk in Hospital Room: A lot  Assist to Climb 3-5 Steps with Railing: Total  PT Basic Mobility Raw Score: 12  CMS 0-100% Score: 68.66%    Treatment Activities:  Functional mobility and transfers, balance assessment, LE ROM and strength ex's, safety awareness and cognition assessed, and activity tolerance noted.  Patient was instructed in frequent ankle pumps and LE movement for circulation and to preserve strength.  Encouraged OOB as able to tolerate.       Educated the patient and daughter to Physical Therapy plan of care and goals of therapy.  Information brochure from out patient given for patient possibly going to [facility].    Patient left seated in chair, without needs and call bell within reach.   RN notified of session outcome.        Therapist PPE during session procedural mask and gloves     [name] Cammon L. [employer], LPTA  [facility]  Physical Medicine and Rehabilitation Dept  Ext. [phone]

## 2023-04-05 NOTE — OT Progress Note (Signed)
 Occupational Therapy Treatment  Patient: [name] [facility]    F773/F773.A  Discharge Recommendations:   Based on today's session: SNF     If SNF is not available, then the patient will need home health services, 24/7 supervision, assistance with mobility, and assistance with ADL's  If pt returns home, DME Recommended for Discharge: Patient already has needed equipment    Unit: [facility] 24 MAIN OBSERVATION     MRN#:  [id number]    OT Received On: 04/05/23  Start Time: 9056  Stop Time: 1006  Time Calculation (min): 23 min  11 minutes of billable OT treatment     OT Visit Number: 2    Precautions and Contraindications:  Falls Risk   Precautions  Weight Bearing Status: no restrictions  Fall Risks: High;History of fall(s);Impaired balance/gait;Impaired mobility;Muscle weakness    Patient Goal  Patient Goal: No stated goals at this time    Assessment: No OT goals met this date, however Patient making progress towards goals evidenced by Patient's ability to transfer OOB to chair with rw use in preparation for ADLs. Patient continues to present with the following deficits:  decreased ROM;decreased strength;balance deficits;decreased independence with ADLs;decreased independence with IADLs;decreased endurance/activity tolerance.  Patient would benefit from continued skilled OT treatment to maximize independence and safety with ADL's, functional transfers and mobility.      Prognosis: Good;With continued OT s/p acute discharge  Progress: Progressing toward goals    Goal Formulation: Patient  Time For Goal Achievement: by time of discharge  ADL Goals  Patient will groom self: Supervision;at edge of bed;5 visits  Patient will dress upper body: Minimal Assist;7 visits  Mobility and Transfer Goals  Pt will perform functional transfers: Minimal Assist;with rolling walker;10 visits  Neuro Re-Ed Goals  Pt will perform dynamic sitting balance: Supervision;to increase ability to complete ADLs;7 visits                       Plan: Continue with Occupational therapy services in acute care to address increasing independence and safety with ADL's. Focus next therapy session on ADL retraining; functional transfer training; UB there. ex.   .   Goal Formulation: Patient  Treatment Interventions: ADL retraining;Functional transfer training;UE strengthening/ROM;Endurance training;Neuro muscular reeducation  Risks/Benefits/POC Discussed with Pt/Family: With patient  OT Frequency Recommended: 2-3x/wk      Subjective: Patient's medical condition is appropriate for Occupational Therapy intervention at this time.  Patient is agreeable to participation in the therapy session. Nursing clears patient for therapy. Co-treat with PT to maximize therapeutic benefit and for Patient's safety.     Pain Assessment  Pain Assessment: No/denies pain    Objective:Observation of Patient/Vital Signs:  Patient is in bed with telemetry, peripheral IV, and male external catheter in place.    Cognition/Neuro Status  Arousal/Alertness: Appropriate responses to stimuli  Attention Span: Appears intact  Orientation Level: Oriented X4  Memory: Appears intact  Following Commands: Follows one step commands with increased time;Follows one step commands with repetition  Safety Awareness: moderate verbal instruction  Insights: Educated in engineer, building services  Problem Solving: Assistance required to generate solutions;Assistance required to identify errors made;Assistance required to implement solutions  Behavior: attentive;calm;cooperative;flat affect  Motor Planning: decreased processing speed;decreased initiation  Coordination: GMC impaired;FMC impaired (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)    Functional Mobility  Supine to Sit Transfers: Moderate Assist;to right;additional time  --Needed assistance for trunk and B/L LE mgmt to facilitate upright sitting at EOB;  step by step instructions for sequencing  Sit to Stand Transfers: Moderate Assist (height of bed  elevated; posterior leaning upon standing)  Stand to Sit Transfers: Minimal Assist (for eccentric control)  Bed to Chair Transfers: Moderate Assist (w/ rw; able to take a few small shuffling steps to turn to sit on chair)  -verbal/tactile instructions for proper hand placement and pacing with all functional transfers for increased safety. Patient educated on proper utilization of rw for improved stability and decreased falls risk.   Physical assistance needed to safely maneuver rw.       Self-care and Home Management  Grooming: Supervision;in chair;setup (to wash face)  Toileting: Maximal Assist;standing;steadying;verbal prompting;clothing management up;clothing management down (Min A to maintain balance at rw, while total assistance needed for bottom hygiene; Patient incontinent of  bowel upon transferring OOB to chair and was unaware of same)    Therapeutic Exercises:Patient guided thru and performed the following UB AROM exercises to faciltate increased endurance and strength for ADL's, positioned seated in arm-chair; 1 x 5 reps of each performed. Patient provided with verbal/visual instructions for proper pacing and big execution of mvmts.     Shoulder AROM: Sitting (Shoulder abd/adduction)  Elbow AROM: Flexion;Extension                             AM-PACT 6 Clicks Daily Activity Inpatient Short Form  Inpatient AM-PACT Performed?: yes  Put On/Take Off Lower Body Clothing: Total  Assist with Bathing: A lot  Assist with Toileting: Total  Put On/Take Off Upper Body Clothing: A lot  Assist with Grooming: A little  Assist with Eating: A little  OT Daily Activity Raw Score: 12  CMS 0-100% Score: 66.57%    Treatment Activities: Pt. Instructed to perform B/L UE AROM exercises for shoulder abd/adduction, elbow AROM F/E intermittently throughout the day to increase endurance and strength for ADL's with visual demonstration provided to increase clarity. Patient educated in importance of OOB sitting for all meals to  increase endurance/strength for activities. Patient seated in arm-chair at end of session with all needs within reach. Patient instructed to ring for nursing for all needs and appeared receptive to all education provided. Chair Alarm activated for Patient's safety and RN notified of session outcome.     Educated the patient to role of occupational therapy, plan of care, goals of therapy and safety with mobility and ADLs, home safety.        Therapist PPE during session procedural mask and gloves  Kate FALCON. Mari, MS,OTR/L,CBIS  [phone]

## 2023-04-05 NOTE — Plan of Care (Signed)
 Problem: Moderate/High Fall Risk Score >5  Goal: Patient will remain free of falls  Flowsheets (Taken 04/05/2023 1000)  High (Greater than 13): HIGH-Visual cue at entrance to patient's room     Problem: Fluid and Electrolyte Imbalance/ Endocrine  Goal: Fluid and electrolyte balance are achieved/maintained  Flowsheets (Taken 04/03/2023 0055 by London Grate, RN)  Fluid and electrolyte balance are achieved/maintained:   Monitor/assess lab values and report abnormal values   Assess and reassess fluid and electrolyte status   Monitor for muscle weakness   Observe for cardiac arrhythmias  Goal: Adequate hydration  Flowsheets (Taken 04/03/2023 0055 by London Grate, RN)  Adequate hydration:   Monitor and assess vital signs and perfusion   Assess for peripheral, sacral, periorbital and abdominal edema   Assess mucus membranes, skin color, turgor, perfusion and presence of edema     Problem: Compromised Sensory Perception  Goal: Sensory Perception Interventions  Flowsheets (Taken 04/05/2023 0920)  Sensory Perception Interventions: Offload heels, Pad bony prominences, Reposition q 2hrs/turn Clock, Q2 hour skin assessment under devices if present     Problem: Compromised Moisture  Goal: Moisture level Interventions  Flowsheets (Taken 04/05/2023 0920)  Moisture level Interventions: Moisture wicking products, Moisture barrier cream     Problem: Compromised Activity/Mobility  Goal: Activity/Mobility Interventions  Flowsheets (Taken 04/05/2023 0920)  Activity/Mobility Interventions: Pad bony prominences, TAP Seated positioning system when OOB, Promote PMP, Reposition q 2 hrs / turn clock, Offload heels     Problem: Compromised Friction/Shear  Goal: Friction and Shear Interventions  Flowsheets (Taken 04/05/2023 0920)  Friction and Shear Interventions: Pad bony prominences, Off load heels, HOB 30 degrees or less unless contraindicated, Consider: TAP seated positioning, Heel foams     Problem: Bladder/Voiding  Goal: Patient will  experience proper bladder emptying during admission and remain free from infection  Flowsheets (Taken 04/03/2023 2208 by London Grate, RN)  Patient will experience proper bladder emptying during admission and remain free from infection: Encourage bladder emptying at regular intervals     Problem: Impaired Mobility  Goal: Mobility/Activity is maintained at optimal level for patient  Flowsheets (Taken 04/05/2023 1011)  Mobility/activity is maintained at optimal level for patient:   Encourage independent activity per ability   Consult/collaborate with Physical Therapy and/or Occupational Therapy     Problem: Peripheral Neurovascular Impairment  Goal: Extremity color, movement, sensation are maintained or improved  Flowsheets (Taken 04/05/2023 1011)  Extremity color, movement, sensation are maintained or improved: Assess and monitor application of corrective devices (cast, brace, splint), check skin integrity     Problem: Compromised skin integrity  Goal: Skin integrity is maintained or improved  Flowsheets (Taken 04/05/2023 1011)  Skin integrity is maintained or improved:   Assess Braden Scale every shift   Turn or reposition patient every 2 hours or as needed unless able to reposition self   Relieve pressure to bony prominences   Keep head of bed 30 degrees or less (unless contraindicated)

## 2023-04-05 NOTE — Nursing Progress Note (Signed)
 RN Shift Note and Trio Rounds template  Date Time: 04/05/23 3:38 PM  Patient Name: [name], [name]  Room No: F773/F773.A  Admit Date: 04/02/2023  Length of Stay: 2  Attending Physician: Gwynne Skiff, Colon, MD   Code Status:Full Code  Admit Status: Inpatient  Shift Note:  Shift Events/ Updates: NONE  Plan:         - Monitor H&H  - PT,OT rec SNF  - Stool occult negative  - urine culture neg D/C ABX            Trio Rounds Template:   04/05/23    Shift Events/ Updates: (Call to Physician, RRT, new symptoms, Changes to exam per RN)  Overnight Events: No overnight events     Orientation:Change in Mental Status and ADL's      A&O X3   Assessment:     Vital Signs: (abnormal and pain is the 5th vital sign)      Pain/VS: 0-No pain    Weight: (Trending, I&O's, Net, diuresing)                                       Respiratory: (increase or decreased O2 demands, O2 needed at home)   RA   Cardiac Rate & Rhythm     Telemetry necessity (Short-Term/ Long-Term)      GI: Bowel concerns    GU: Bladder/urine concerns  Does Patient have Foley Catheter (anticipated removal date)        Diet:      Most Recent diet: Adult diet Regular  Previous diet: Diet regular    Patient has abnormal labs (K, Mg, hb, Cr, LFT's, Cultures resulted in last 24 hours)       Patient had Imaging resulted in 24 hours   No results found.    Antibiotics: CEFTRIAXONE  1 G IVP (+DILUENT )  CEFTRIAXONE  1 GM MBP    Anticoagulants: Yes    Mobility:     PT/OT, sitters            Yes  Yes     Psychosocial:           Psychosocial:     Patient/family have questions on plan of care/ discharge plans  No concerns/questions on plan of care     Medication history updated     Dispense report 100 days   Unit Protocols:     Patient has [employer]  (Anticipated removal date)                                       NO   Fall risk                                                                    HIGH     Patient has Pressure Ulcer or at Risk                           Glucose  less than 100 and on insulin  Readmission in last 30 days        Patient transferred last night from another unit                  3:38 PM   04/05/23

## 2023-04-06 DIAGNOSIS — R531 Weakness: Secondary | ICD-10-CM

## 2023-04-06 LAB — BASIC METABOLIC PANEL
Anion Gap: 8 (ref 5.0–15.0)
BUN: 19 mg/dL (ref 9–28)
CO2: 22 meq/L (ref 17–29)
Calcium: 8.2 mg/dL (ref 7.9–10.2)
Chloride: 106 meq/L (ref 99–111)
Creatinine: 0.8 mg/dL (ref 0.5–1.5)
GFR: >60 mL/min/{1.73_m2} (ref >=60.0)
Glucose: 85 mg/dL (ref 70–100)
Potassium: 4.2 meq/L (ref 3.5–5.3)
Sodium: 136 meq/L (ref 135–145)

## 2023-04-06 LAB — CBC
Absolute nRBC: 0 10*3/uL (ref <=0.00)
Hematocrit: 30.9 % — ABNORMAL LOW (ref 37.6–49.6)
Hemoglobin: 10.5 g/dL — ABNORMAL LOW (ref 12.5–17.1)
MCH: 31.5 pg (ref 25.1–33.5)
MCHC: 34 g/dL (ref 31.5–35.8)
MCV: 92.8 fL (ref 78.0–96.0)
MPV: 9.8 fL (ref 8.9–12.5)
Platelet Count: 131 10*3/uL — ABNORMAL LOW (ref 142–346)
RBC: 3.33 10*6/uL — ABNORMAL LOW (ref 4.20–5.90)
RDW: 12 % (ref 11–15)
WBC: 6.03 10*3/uL (ref 3.10–9.50)
nRBC %: 0 /100{WBCs} (ref <=0.0)

## 2023-04-06 NOTE — Discharge Instr - AVS First Page (Addendum)
 Reason for your Hospital Admission:  Generalized weakness   Parkinsons Disease      Instructions for after your discharge:  Please follow up with your PCP as advised.   Return to ED in case of symptoms including but not limited to worsening weakness, nausea/vomiting, fever or chills.

## 2023-04-06 NOTE — Progress Notes (Signed)
 CM Progress note:     [facility]position[facility]  Anticipated D/C Date: 04/06/23    Patient to D/C today to [facility] SN[facility] room 137A with report to [phone]. [phone]. Dtr. to transport at 1pm.     Deland Motto RN Case Manager  [facility]  [phone]      04/06/23 1212   Discharge Disposition   Patient preference/choice provided? Yes   Physical Discharge Disposition SNF   Was patient sent with auth pending to the post-acute facility? No   Receiving facility, unit and room number: [facility] room 137B   Nursing report phone number: [phone]   Mode of Transportation Car   Pick up time 1pm   Patient/Family/POA notified of transfer plan Yes   Patient agreeable to discharge plan/expected d/c date? Yes   Family/POA agreeable to discharge plan/expected d/c date? Yes   Bedside nurse notified of transport plan? Yes   CM Interventions   Multidisciplinary rounds/family meeting before d/c? Yes   Medicare Checklist   Is this a Medicare patient? Yes   Patient received 1st IMM Letter? Yes   3 midnight inpatient qualifying stay (SNF only) Yes   If LOS 3 days or greater, did patient received 2nd IMM Letter? n/a   Medicaid/DMAS   Medicaid Pre-Screening completed Yes   DMAS 95 MI/MR completed and faxed Yes   DMAS 95 MI/MR trigger Level 2 Screening? No

## 2023-04-06 NOTE — Plan of Care (Signed)
 Problem: Moderate/High Fall Risk Score >5  Goal: Patient will remain free of falls  Outcome: Progressing  Flowsheets (Taken 04/06/2023 0845)  High (Greater than 13):   HIGH-Consider use of low bed   HIGH-Apply yellow Fall Risk arm band   HIGH-Visual cue at entrance to patient's room     Problem: Compromised Activity/Mobility  Goal: Activity/Mobility Interventions  Outcome: Progressing  Flowsheets (Taken 04/06/2023 0947)  Activity/Mobility Interventions: Pad bony prominences, TAP Seated positioning system when OOB, Promote PMP, Reposition q 2 hrs / turn clock, Offload heels     Problem: Bladder/Voiding  Goal: Patient will experience proper bladder emptying during admission and remain free from infection  Outcome: Progressing  Flowsheets (Taken 04/06/2023 0947)  Patient will experience proper bladder emptying during admission and remain free from infection: Apply urinary containment device as appropriate and/or per order     Problem: Compromised skin integrity  Goal: Skin integrity is maintained or improved  Outcome: Progressing  Flowsheets (Taken 04/06/2023 0947)  Skin integrity is maintained or improved:   Assess Braden Scale every shift   Turn or reposition patient every 2 hours or as needed unless able to reposition self   Relieve pressure to bony prominences   Avoid shearing   Keep skin clean and dry   Monitor patient's hygiene practices   Encourage use of lotion/moisturizer on skin   Keep head of bed 30 degrees or less (unless contraindicated)

## 2023-04-06 NOTE — Plan of Care (Signed)
 Problem: Moderate/High Fall Risk Score >5  Goal: Patient will remain free of falls  Outcome: Progressing  Flowsheets (Taken 04/05/2023 2030)  High (Greater than 13):   HIGH-Visual cue at entrance to patient's room   HIGH-Apply yellow Fall Risk arm band   HIGH-Consider use of low bed     Problem: Fluid and Electrolyte Imbalance/ Endocrine  Goal: Fluid and electrolyte balance are achieved/maintained  Outcome: Progressing  Flowsheets (Taken 04/06/2023 0212)  Fluid and electrolyte balance are achieved/maintained:   Monitor/assess lab values and report abnormal values   Assess and reassess fluid and electrolyte status   Monitor for muscle weakness     Problem: Compromised Sensory Perception  Goal: Sensory Perception Interventions  Outcome: Progressing     Problem: Compromised Moisture  Goal: Moisture level Interventions  Outcome: Progressing     Problem: Compromised Activity/Mobility  Goal: Activity/Mobility Interventions  Outcome: Progressing     Problem: Compromised Nutrition  Goal: Nutrition Interventions  Outcome: Progressing     Problem: Compromised Friction/Shear  Goal: Friction and Shear Interventions  Outcome: Progressing     Problem: Impaired Mobility  Goal: Mobility/Activity is maintained at optimal level for patient  Outcome: Progressing  Flowsheets (Taken 04/05/2023 1011 by Arvilla Bail, RN)  Mobility/activity is maintained at optimal level for patient:   Encourage independent activity per ability   Consult/collaborate with Physical Therapy and/or Occupational Therapy     Problem: Compromised skin integrity  Goal: Skin integrity is maintained or improved  Outcome: Progressing     Problem: Nutrition  Goal: Nutritional intake is adequate  Outcome: Progressing

## 2023-04-06 NOTE — Discharge Summary (Signed)
 [facility]  Internal Medicine Hospitalists  Discharge Summary          Date of Admission: 04/02/2023  Date of Discharge: No discharge date for patient encounter.    Discharge Diagnoses:    Generalized weakness probably due to underlying Parkinson's disease    Additional Diagnoses:          Hospital Course:      Reason for Admission:   Generalized weakness         Hospital Course:    #  Generalized weakness   -- Unclear etiology, probably due to underlying Parkinson's disease  --Although UA was positive, urine culture is negative-UTI ruled out  -- CT Head negative for acute IC abnormality. CXR Negative.   -- Antibiotic discontinued  --Influenza A and B, rapid COVID-negative in the ED.   --Status post gentle IV fluids  --Encourage po hydration. Prevent constipation. Monitor for diarrhea if any.  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  He is followed periodically by neurology on an outpatient basis.     # Hypertension:  Normotensive here, continue current medications and trend BPs.     # Hyperlipidemia:  Normal transaminases, continue statin.     # Parkinsons Disease  # Lewy body dementia   --Continue Sinemet   -He is on multiple psychotropic medications which have been continued including Effexor , Celexa , Seroquel  nightly, bupropion   -May benefit from palliative care consult at some point noting that he is on several serotonin modulating medications              Discharge Day Exam:   Temp:  [97.7 F (36.5  C)-98.2 F (36.8 C)] 97.7 F (36.5 C)  Heart Rate:  [68-72] 70  Resp Rate:  [17-22] 22  BP: (116-163)/(56-70) 163/70    General: resting comfortably, no apparent distress, appears weak.   HEENT:NC,AT,PERRLA, EOMI  Neck: Supple, no JVD, no bruits  Chest: bilateral air entry present, no use of accessory respiratory muscles, no wheezing, no rales.  Heart: regular, S1-S2 present, no murmur.  Abdomen: soft, non-tender, Positive bowel sounds, nondistended, no organomegaly or masses  Extremities: Pulse 1+ bilateral, no edema  Skin: no rash  Neurological exam: Alert awake oriented x 3, no deficits. Follows commands. Normal motor strength. Normal sensation.         Pertinent Labs:   CBC:   Recent Labs   Lab 04/06/23  0630 04/05/23  0714 04/04/23  0654   Sodium 136 138 138   Potassium 4.2 4.2 3.9   Chloride 106 107 108   CO2 22 24 22    BUN 19 16 22    Creatinine 0.8 0.7 0.7   Calcium  8.2 8.4 8.2   Glucose 85 91 81     BMP:   Recent Labs   Lab 04/06/23  0630 04/05/23  0714 04/04/23  0654   Sodium 136 138 138   Potassium 4.2 4.2 3.9  Chloride 106 107 108   CO2 22 24 22    BUN 19 16 22    Creatinine 0.8 0.7 0.7   Calcium  8.2 8.4 8.2   Glucose 85 91 81     LFT:   Recent Labs   Lab 04/02/23  1643   Albumin 4.2   Protein, Total 8.6*   Bilirubin, Total 0.4   Alkaline Phosphatase 88   ALT <6   AST (SGOT) 16     Coags:           Radiology and Procedures:        Discharge Medications and Documented Allergies:        Discharge Medication List        Taking      acetaminophen  325 MG tablet  Dose: 650 mg  Commonly known as: TYLENOL   Take 2 tablets (650 mg) by mouth 2 (two) times daily     amLODIPine  10 MG tablet  Dose: 10 mg  Commonly known as: NORVASC   Take 1 tablet (10 mg) by mouth daily     aspirin  EC 81 MG EC tablet  Dose: 1 tablet  Take 1 tablet (81 mg) by mouth daily     atorvastatin  40 MG tablet  Dose: 1 tablet  Commonly known as: LIPITOR  Take 1 tablet (40 mg) by mouth nightly     buPROPion  SR 150 MG 12 hr tablet  Dose: 150  mg  Commonly known as: WELLBUTRIN  SR  Take 1 tablet (150 mg) by mouth 2 (two) times daily     carboxymethylcellulose 0.5 % ophthalmic solution  Dose: 1 drop  Commonly known as: REFRESH TEARS  Place 1 drop into both eyes 2 (two) times daily     cetirizine  10 MG chewable tablet  Dose: 10 mg  Commonly known as: ZyrTEC   Chew 1 tablet (10 mg) by mouth daily     citalopram  20 MG tablet  Dose: 20 mg  Commonly known as: CeleXA   Take 1 tablet (20 mg) by mouth daily     Crexont  52.5-210 MG capsule  Dose: 2 capsule  Generic drug: carbidopa -levodopa  ER  Take 2 capsules by mouth 3 (three) times daily     donepezil  10 MG tablet  Dose: 10 mg  Commonly known as: ARICEPT   Take 1 tablet (10 mg) by mouth nightly     ipratropium 0.06 % nasal spray  Dose: 2 spray  Commonly known as: ATROVENT  2 sprays by Nasal route 4 (four) times daily     loperamide 2 MG tablet  Dose: 2 mg  Commonly known as: IMODIUM A-D  Take 1 tablet (2 mg) by mouth 4 (four) times daily as needed for Diarrhea     Mometasone Furoate 50 MCG/ACT Aero  Inhale into the lungs     omeprazole 20 MG capsule  Dose: 20 mg  Commonly known as: PriLOSEC  Take 1 capsule (20 mg) by mouth daily     QUEtiapine  25 MG tablet  Dose: 25 mg  Commonly known as: SEROquel   Take 1 tablet (25 mg) by mouth nightly     rivaroxaban  10 MG Tabs  Dose: 1 tablet  Commonly known as: XARELTO   Take 1 tablet (10 mg) by mouth daily     venlafaxine  75 MG tablet  Dose: 75 mg  Commonly known as: EFFEXOR   Take 1 tablet (75 mg) by mouth 2 (two) times daily     vitamin B-12 500 MCG tablet  Dose: 500 mcg  What changed: Another  medication with the same name was removed. Continue taking this medication, and follow the directions you see here.  Commonly known as: CYANOCOBALAMIN   Take 1 tablet (500 mcg) by mouth daily            STOP taking these medications      celecoxib  200 MG capsule  Commonly known as: CeleBREX               Allergies[1]         Disposition:     Discharge Disposition: SNF:      Discharge Code  Status: Full Code    Patient Emergency Contact:  Extended Emergency Contact Information  Primary Emergency Contact: More,Kristin  Mobile Phone: [phone]  Relation: Daughter  Preferred language: English  Secondary Emergency Contact: More,David  Mobile Phone: [phone]  Relation: Son in law  Preferred language: English    Discharge Instructions:     Patient Instructions: (See AVS for full details)       Most Recent Diet Order:  Orders Placed This Encounter   Procedures    Adult diet Regular       Activity/Weight Bearing Status: Activity as tolerated    Wound Care:        Outpatient Follow-Up Plan:     Instructions for PCP:       Appointments:   Follow-up Information       Relyea, Cierra Jade, FNP. Schedule an appointment as soon as possible for a visit in 1 week(s).    Specialty: Family Nurse Practitioner  Contact information:  [address]  [place] [address]  [phone]                             Pending Labs, Microbiology, and Pathology:  Unresulted Labs       None            Attestations and Signatures:     Minutes spent coordinating discharge and reviewing discharge plan: 46 minutes               [1]   Allergies  Allergen Reactions    Ace Inhibitors Cough

## 2023-04-06 NOTE — Progress Notes (Addendum)
 PIV removed, Reviewed d/c instructions with pt and daughter, pt verbalized understanding. Copy of instructions given to pt. Pt d/c to [facility], transport to SNF by family. Taken downstairs in wheelchair by tech. All belongings taken w/ pt.        Hand off report given to Natalie RN via Phone call [facility].

## 2023-09-22 ENCOUNTER — Telehealth: Payer: Self-pay | Admitting: Nurse Practitioner

## 2023-09-22 ENCOUNTER — Telehealth: Payer: Self-pay | Admitting: Neurological Surgery

## 2023-09-22 NOTE — Telephone Encounter (Incomplete)
 09/22/23  Returned call Re: schedule shunt check appt, can offer 10/15 at 230 at [facility] with [facility]. CHIPPER

## 2023-09-22 NOTE — Telephone Encounter (Signed)
 Copied from CRM [id number]. Topic: Appointment Scheduling - Schedule Appointment  >> Sep 22, 2023 10:14 AM Alison MATSU wrote:  Patients daughter  contacted the call center requesting an update and call back regarding the status of requesting An Appt for shunt check after MRI  , MRI currently scheduled 10/14  .     Caller:Kristen daughter   Telephone Number:[phone]  Can follow-up please be completed.   Thank you !

## 2023-09-22 NOTE — Telephone Encounter (Signed)
 Copied from CRM #[id number]. Topic: Non-Appointment Question - More Information  >> Sep 22, 2023  2:11 PM Pope wrote:  [name] called about Non-Appointment Question - More Information.  Additional details:  Patient is returning a call about patient/scheduling with Stacia. She would like to know if it is okay to come in 24 hours after the MRI. The MRI is currently scheduled for 10-17-23. She would also like to know if she should reschedule the MRI for another day, and if so, which days could Stacia see this patient at the [place] [facility] in October. Please call her regarding these questions when possible. Thank you.

## 2023-10-02 ENCOUNTER — Telehealth: Payer: Self-pay

## 2023-10-02 NOTE — Telephone Encounter (Signed)
 This nurse contacted patient in response to call center message regarding follow up for shunt adjustment post MRI. Patient has a Certas valve, set to 5 during last appt. Patient has an MRI scheduled for 10/14 and daughter is helping coordinate follow-up appt for shunt adjustment. Per pt's daughter, she was told to bring patient in for shunt check same day or within 24 hrs post MRI completion because of the type of shunt patient has. This differs from [place] [facility] protocol to have patient be seen within one week of MRI completion for shunt adjustment. APP [place] has been consulted with for clarification. Pt's daughter is aware that scheduling coord will contact her once the aforementioned interval is clarified by APP [place].

## 2023-10-17 ENCOUNTER — Other Ambulatory Visit (INDEPENDENT_AMBULATORY_CARE_PROVIDER_SITE_OTHER): Payer: Self-pay | Admitting: Neurology

## 2023-10-23 ENCOUNTER — Ambulatory Visit: Attending: Neurological Surgery | Admitting: Nurse Practitioner

## 2023-10-23 ENCOUNTER — Encounter: Payer: Self-pay | Admitting: Nurse Practitioner

## 2023-10-23 DIAGNOSIS — G912 (Idiopathic) normal pressure hydrocephalus: Secondary | ICD-10-CM | POA: Insufficient documentation

## 2023-10-23 DIAGNOSIS — Z982 Presence of cerebrospinal fluid drainage device: Secondary | ICD-10-CM | POA: Insufficient documentation

## 2023-10-23 NOTE — Progress Notes (Signed)
 [facility]  Follow up Note    Previous Impression/Plan   (02/27/23)   Impression   78 y.o. 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     Recent falls- HCT showed no evidence of acute hemorrhage and stable ventriculomegaly when compared to 10/24    Plan   Using the shunt programmer, the setting was checked and found to be at 5  Shunt is programmable and MRI compatible   Encouraged to follow up with neurologist Dr. Alethea    Follow-up   PRN     HPI     Chief Complaint   Patient presents with    S/P VP shunt     8 month f/u for shunt adjustment post MRI.      [name] 78 y.o. 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 He presents today for a shunt setting check after getting a cervical MRI ordered by neurology Dr. Alethea. He denies any changes since last seen.     Physical Examination     Neurologic Exam    Mental Status    Alert   Speech clear      Cranial Nerves      CN III, IV, VI   Pupils are equal, round, and reactive to light.  Extraocular motions are normal.      CN VII: Facial expression full, symmetric.     CN VIII: Hearing intact b/l    CN XI: Shoulder shrug symmetric    Motor Exam   Overall muscle tone: normal     Strength   BUE/BLE 5/5     Gait: normal    Shunt valve pumps and refills     Review of Systems   Review of Systems  Constitutional: negative for fever or chills.  HENT: Negative for tinnitus or rhinorrhea   Eyes: Negative for visual disturbance.   Musculoskeletal: Negative for gait problem. Negative for neck pain. Negative for back pain.   Skin: Negative for wounds.  Neurological: no history of seizures.  Respiratory: Negative for cough or wheezing  Endocrine: Negative for cold or heat intolerance   GI: Negative for constipation or diarrhea     Radiology Interpretation     None  new to review       Impression   78 y.o. 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     Plan   Using the shunt programmer, the setting was checked and found to be at 5  Shunt is programmable and MRI compatible   Encouraged to follow up with neurologist Dr. Alethea for cMRI results.     Follow-up   PRN     Noreta Kue A Shrihan Putt, NP

## 2023-10-26 ENCOUNTER — Encounter: Payer: Self-pay | Admitting: Neurology

## 2023-10-26 ENCOUNTER — Ambulatory Visit: Attending: Neurology | Admitting: Neurology

## 2023-10-26 VITALS — BP 109/65 | HR 86 | Temp 97.6°F | Resp 15 | Ht 68.0 in | Wt 180.0 lb

## 2023-10-26 DIAGNOSIS — G4700 Insomnia, unspecified: Secondary | ICD-10-CM | POA: Insufficient documentation

## 2023-10-26 DIAGNOSIS — F068 Other specified mental disorders due to known physiological condition: Secondary | ICD-10-CM | POA: Insufficient documentation

## 2023-10-26 DIAGNOSIS — E139 Other specified diabetes mellitus without complications: Secondary | ICD-10-CM | POA: Insufficient documentation

## 2023-10-26 DIAGNOSIS — F32A Depression, unspecified: Secondary | ICD-10-CM | POA: Insufficient documentation

## 2023-10-26 DIAGNOSIS — G20A1 Parkinson's disease without dyskinesia, without mention of fluctuations: Secondary | ICD-10-CM | POA: Insufficient documentation

## 2023-10-26 DIAGNOSIS — Z8673 Personal history of transient ischemic attack (TIA), and cerebral infarction without residual deficits: Secondary | ICD-10-CM | POA: Insufficient documentation

## 2023-10-26 DIAGNOSIS — G912 (Idiopathic) normal pressure hydrocephalus: Secondary | ICD-10-CM | POA: Insufficient documentation

## 2023-10-26 DIAGNOSIS — R413 Other amnesia: Secondary | ICD-10-CM | POA: Insufficient documentation

## 2023-10-26 DIAGNOSIS — E531 Pyridoxine deficiency: Secondary | ICD-10-CM | POA: Insufficient documentation

## 2023-10-26 DIAGNOSIS — G20C Parkinsonism, unspecified: Secondary | ICD-10-CM | POA: Insufficient documentation

## 2023-10-26 DIAGNOSIS — E559 Vitamin D deficiency, unspecified: Secondary | ICD-10-CM | POA: Insufficient documentation

## 2023-10-26 MED ORDER — RIVASTIGMINE 9.5 MG/24HR TD PT24
MEDICATED_PATCH | TRANSDERMAL | 3 refills | Status: DC
Start: 2023-10-26 — End: 2023-10-26

## 2023-10-26 MED ORDER — MEMANTINE HCL 10 MG PO TABS
ORAL_TABLET | ORAL | 3 refills | Status: AC
Start: 2023-10-26 — End: ?

## 2023-10-26 MED ORDER — FLUTICASONE PROPIONATE 50 MCG/ACT NA SUSP
NASAL | 6 refills | Status: AC
Start: 2023-10-26 — End: ?

## 2023-10-26 MED ORDER — RIVASTIGMINE 9.5 MG/24HR TD PT24
MEDICATED_PATCH | TRANSDERMAL | 3 refills | Status: AC
Start: 2023-10-26 — End: ?

## 2023-10-26 MED ORDER — RIVASTIGMINE 4.6 MG/24HR TD PT24
MEDICATED_PATCH | TRANSDERMAL | 0 refills | Status: DC
Start: 2023-10-26 — End: 2023-10-26

## 2023-10-26 MED ORDER — MEMANTINE HCL 10 MG PO TABS
ORAL_TABLET | ORAL | 3 refills | Status: DC
Start: 2023-10-26 — End: 2023-10-26

## 2023-10-26 MED ORDER — RIVASTIGMINE 4.6 MG/24HR TD PT24: MEDICATED_PATCH | TRANSDERMAL | 0 refills | Status: DC

## 2023-10-26 NOTE — Progress Notes (Addendum)
 [email or web address]     [email or web address]      Re: [name]    DOB: 1945/07/01    MRN: [id number]    CC: PD    History of Present Illness:  [name] is a 78 y.o. male, right handed, with H/O PD (diag 2023), NPH s/p VP shunt (placed 2018 done at [facility]), CVA (July 2022 w/ residual Rt sided weakness and aphasia), HTN, HLD, Afib, Depression p/t [place] for initial evaluation of PD    Pt is currently on sinemet  25/100mg  1 tab 3x daily, venlafaxine , bupropion , donepezil , ipratropium bromide spray, trazodone, B12. He started shuffling in 2022 and began having dexterity issues and problems with ADLs. Then he had stroke in July 2022 while in assisted living. He moved her in 2022 and has been seeing Dr Alethea and about 2 yrs ago was diag with PD. He reacted positively to levadopa. Since Jan 2025 he has had a persistent decline with increased falls. He had spinal MRI to look for cause of falls. He started having more delusions after he was put on ER levadopa so he was switched back to sinemet  in June. He has significant depression. They are not sure what is from NPH vs PD vs CVA. He has limited ability to use his hands so is no longer feeding himself d/t in part pain but also problems with dexterity. His family can no longer get him in and out of car as he is no longer able to help move. He is wheelchair bound now and it has been months since he was able to walk with walker.     Motor Symptoms: Freezing of gait (+), Wearing off (+), Dyskinesias (+), Falls (+), Festination (-), Propulsion (-), Tremor (+) left hand, shuffling gait (+), balance problems (+)    Non-Motor Symptoms: Visual hallucinations: (+) talking to people not there, Memory problems: (+), Depression: (+), Dysphagia: (+), Orthostasis: (-), Sialorrhea: (-), Constipation: (-), Nocturia: (-), Fragmented sleep: (+), Vivid Dreams: (-), RBD: (-), Snoring: (-), Anosmia: (-), Cramps: (-), Falls: (+), Compulsive  Behaviors (-), Sleep attacks (-), Pedal edema (-), Micrographia (-), PBA (-)    Atypical Features: orthostasis (-), involuntary incontinence (+), incomplete bladder emptying (-), pseudobulbar affect (-), early onset of dementia or hallucinations (+), early onset of falls (+), alien limb syndrome (-)    Assistive Devices using: wheelchair    Meds tried/ failed: crexont , venlafaxine , bupropion , citalopram , donepezil , ipratropium bromide spray, quetiapine , B12, donepezil , memantine, rivastigmine capsule, galantamine    Past Medical History:  Medical History[1]    Past Surgical History:  Past Surgical History[2]    Current Medications:  Current Medications[3]    Allergies:  Allergies[4]    Family History: denies PD, tremor, dementia  Father: throat cancer  Mother: died young of breast cancer  Siblings: 1 brother - heart problems  Other:    Social History:  Smoking: (-)  ETOH: (+) occasional wine  IVDA: (-)  Exposures: TBI (-), toxins/ Insecticides/ Pesticides (-), [place]  (-), Anti-emetics (-), Antipsychotics (-)  Marital status: widow  Living status: lives at [facility] in long term care  Children: 2 (one deceased age 92yo)  Employment: retired from job working in [employer] in [employer])  Education:    Driving status: no longer driving    Review of Systems:  As per patient questionnaire and see HPI above. All other systems were reviewed and are negative.    Vitals: BP 109/65 (BP Site: Right arm, Patient Position: Sitting, Cuff  Size: Medium)   Pulse 86   Temp 97.6 F (36.4 C) (Temporal)   Resp 15   Ht 1.727 m (5' 8)   Wt 81.6 kg (180 lb)   SpO2 99% Comment: RA  BMI 27.37 kg/m     Detailed Neurological Exam:  Speech: Speech is dysarthric and hypophonic  Cognition: The patient is alert and oriented to person only; MoCA 12/30 with global deficits   Cranial Nerves: Extraocular movements are intact. Trigeminal sensation is equal and intact to light touch in V1 through V3 distribution bilaterally  and the muscles of mastication are normal. The face is symmetric. The palate elevates in the midline. Voice is normal. Hearing is intact bilaterally. Shoulder shrug is normal (Normal strength of the trapezius and sternocleidomastoid muscles bilaterally). The tongue has normal motion without fasciculations and protrudes midline.  Coordination: Normal finger to nose; No dysmetria or dysdiadochokinesia. slowed rapid alternating movements.  Gait: wheelchair bound  Observation: No asymmetry, no atrophy, and no involuntary movements noted.  Tone: increased muscle tone throughout.  Posture: unable to assess  Strength: Strength is V/V in the upper and lower limbs.  Vibratory Sensation: Not assessed  Light Touch: Normal light touch sensation in upper and lower extremities  Proprioception: Not assessed  Romberg: Not assessed  Pin Prick: Not assessed  Temperature: Not assessed  Cortical Sensory Modalities: Not assessed  Reflex Exam: DTR's: Deep tendon reflexes in the upper and lower extremities are normal bilaterally.    Studies:   MoCA 10/26/23: 12/30 with global deficits     Procedure: N/A    Assessment:  [name] is a 78 y.o. male, with H/O PD, NPH s/p VP shunt 09/04/20, HTN, HLD, Afib, Depression p/t [place] for initial evaluation of PD    Neuro Exam Significant for: exam deferred to next visit. MoCA 12/30 with global deficits     Differential Diagnosis: The symptomatology, as well as the relatively gradual progression in conjunction with as initial robust response to levodopa  is more supportive of a diagnosis of idiopathic Parkinson's disease with superimposed NPH. 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 Also, there is no evidence of excessive cerebellar signs or dysautonomia which may indicate MSA.      1. Parkinsonism, unspecified Parkinsonism type (CMS/HCC)        2. Memory loss  Heavy Metals with Cadmium    Folate    Whole Blood  Vitamin B1 (Thiamine)    Vitamin D , 25 OH, Total    Vitamin B6    Vitamin B12    Syphilis Screen, IgG and IgM    Hemoglobin A1C    Thyroid Stimulating Hormone (TSH) with Reflex to Free T4      3. NPH (normal pressure hydrocephalus) (CMS/HCC)        4. Insomnia, unspecified type        5. 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 History of CVA (cerebrovascular accident)        8. Vitamin D  deficiency  Vitamin D , 25 OH, Total      9. Vitamin B6 deficiency  Vitamin B6      10. Diabetes mellitus of other type without complication, unspecified whether long term insulin use (CMS/HCC)  Hemoglobin A1C        Plan:  -Rec Blood work  -Cont sinemet  25/100mg  1 tab 3x  daily at 7am-11am-3pm; will consider switching back to crexont  or use vyalev after stabilize cognition  -Rec space sinemet  away from food that contains protein by 1 hr  -Rec switch donepezil  to Exelon patch 4.6 mg 1 patch daily for 1 month, rotating sites then increase to 9.5 mg 1 patch daily, rotating sites  -If develop rash from patch, get over the counter Flonase, spray on skin, let dry, then apply patch.  -Tried and failed donepezil , memantine, rivastigmine capsule, galantamine  -Rec start memantine 10mg  1/2 tab 2x daily x2 weeks then increase to 1 tab 2x daily  -stagger start of 2 new meds by 2 weeks  -Rec start nuplazid 34mg  nightly at bedtime; will start approval process but don't start yet  -The patient has had an inadequate treatment response to seroquel . An attempt to adjust Parkinson's disease mediations in order to reduce psychosis without worsening motor symptoms has been done. Hallucinations and delusions have been recurrent/ continuous for at least 1 month  -Cont trazodone  -Cont venlafaxine , bupropion , citalopram    -Cont ipratropium bromide spray  -Cont f/u with [facility] to [facility] the [facility] shunt - Dr Noelle and PA Molly  -will monitor BP and consider stopping amlodipine  if needed  -Cont  aspirin   -Cont atorvastatin    -discussed stroke risk factors HTN, HLD, DM, prior CVA  -Rec check stroke labs every 6 months  -LDL goal <70  -Fall precautions/ prevention stressed  -This is an ongoing continuous care relationship  -F/U in 3 months    The total visit time was 90 min (minus time spent on procedure/ programming), examining and counseling the patient regarding current medication and treatment options available to the patient, reviewing records and/or consulting with other providers.           Director of Autonomic Disorders/ Dysautonomia Program    Electronically signed by: @[email or web address] @    Signature Derived From [employer], October 26, 2023, 4:47 PM         [1]   Past Medical History:  Diagnosis Date    Abnormal cardiovascular stress test 09/19/2011    Echo/stress suggest RCA/left circumflex distribution ischemia; negative heart cath in 2001.  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)     History of DVT of lower extremity     Hyperlipemia     Hypertension     Major depression, recurrent     Mild cognitive impairment 10/16/2015    Normal pressure hydrocephalus (CMS/HCC) 06/21/2016    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    Parkinson's disease (CMS/HCC)     Parkinsons (CMS/HCC)     Phlebitis and thrombophlebitis of superficial vessels of lower extremities, bilateral 09/30/2011    Pulmonary fibrosis (CMS/HCC)     Shunt malfunction, subsequent encounter 09/04/2020    recent stroke and symptoms  of shunt failure with shuntogram w/ evidence of distal obstruction, s/p elective: 09/04/2020: R frontal VP shunt revision (valve revision)    Situational  mixed anxiety and depressive disorder     TIA (transient ischemic attack)     Tubular adenoma of rectum     Varicose veins of both legs with edema     Venous (peripheral) insufficiency    [2]   Past Surgical History:  Procedure Laterality Date    CARDIAC CATHETERIZATION  09/2011    Normal coronary arteries    CORONARY ANGIOPLASTY WITH STENT PLACEMENT  2017    NASAL POLYP EXCISION      PHACOEMULSIFICATION, IOL CATARACT Right 2013    Laser    VENTRICULOPERITONEAL SHUNT  09/09/2016    shunt revision 2022   [3]   Current Outpatient Medications:     acetaminophen  (TYLENOL ) 325 MG tablet, Take 2 tablets (650 mg) by mouth 2 (two) times daily, Disp: , Rfl:     amLODIPine  (NORVASC ) 10 MG tablet, Take 1 tablet (10 mg) by mouth daily, Disp: , Rfl:     aspirin  EC 81 MG EC tablet, Take 1 tablet (81 mg) by mouth daily, Disp: , Rfl:     atorvastatin  (LIPITOR) 40 MG tablet, Take 1 tablet (40 mg) by mouth nightly, Disp: , Rfl:     bisacodyl 5 MG EC tablet, Take 2 tablets (10 mg) by mouth once daily as needed for Constipation, Disp: , Rfl:     buPROPion  SR (WELLBUTRIN  SR) 150 MG 12 hr tablet, Take 1 tablet (150 mg) by mouth 2 (two) times daily, Disp: , Rfl:     carbamide peroxide (DEBROX) 6.5 % otic solution, 5 drops 2 (two) times daily, Disp: , Rfl:     carbidopa -levodopa  (SINEMET ) 25-100 MG per tablet, Take 1 tablet by mouth 3 (three) times daily, Disp: , Rfl:     carboxymethylcellulose (REFRESH TEARS) 0.5 % ophthalmic solution, Place 1 drop into both eyes 2 (two) times daily, Disp: , Rfl:     cetirizine  (ZyrTEC ) 10 MG chewable tablet, Chew 1 tablet (10 mg) by mouth daily, Disp: , Rfl:     gabapentin  (NEURONTIN ) 100 MG capsule, Take 1 capsule (100 mg) by mouth 3 (three) times daily, Disp: , Rfl:     ipratropium (ATROVENT) 0.06 % nasal spray, 2 sprays by Nasal route 4 (four) times  daily, Disp: , Rfl:     loperamide (IMODIUM A-D) 2 MG tablet, Take 1 tablet (2 mg) by mouth 4 (four) times daily as needed for Diarrhea, Disp: , Rfl:     melatonin 3 mg tablet, Take 1 tablet (3 mg) by mouth, Disp: , Rfl:     omeprazole (PriLOSEC) 20 MG capsule, Take 1 capsule (20 mg) by mouth daily, Disp: , Rfl:     Pimavanserin Tartrate (Nuplazid) 34 MG Cap, Take 1 capsule (34 mg) by mouth once at bedtime, Disp: , Rfl:     polyethylene glycol (MIRALAX) 17 g packet, Take 17 g by mouth once daily, Disp: , Rfl:     rivaroxaban  (XARELTO ) 10 MG Tab, Take 1 tablet (10 mg) by mouth daily, Disp: , Rfl:     senna (SENOKOT) 8.6 MG tablet, Take 1 tablet (8.6 mg) by mouth once daily, Disp: , Rfl:     traZODone (DESYREL) 150 MG tablet, Take 1 tablet (150 mg) by mouth once daily, Disp: , Rfl:     venlafaxine  (EFFEXOR ) 75 MG tablet, Take 1 tablet (75 mg) by mouth 2 (two) times daily, Disp: , Rfl:     vitamin B-12 (CYANOCOBALAMIN ) 500 MCG tablet, Take 1 tablet (500 mcg) by mouth daily, Disp: , Rfl:  fluticasone (FLONASE) 50 MCG/ACT nasal spray, -If develop rash from rivastigmine patch, spray flonase on skin where plan on placing patch, let dry, then apply patch, Disp: 15.8 mL, Rfl: 6    memantine (NAMENDA) 10 MG tablet, 1/2 tab by mouth 2x daily x2 weeks then increase to 1 tab 2x daily, Disp: 180 tablet, Rfl: 3    rivastigmine (EXELON) 4.6 MG/24HR, 1 patch topical daily, rotating sites x1 month then increase to 9.5mg  patch, Disp: 30 patch, Rfl: 0    rivastigmine (EXELON) 9.5 MG/24HR, 1 patch topical daily, rotating sites; start after completing 1 month of 4.6mg  patch, Disp: 90 patch, Rfl: 3  [4]   Allergies  Allergen Reactions    Ace Inhibitors Cough

## 2023-10-26 NOTE — Addendum Note (Signed)
 Addended by: Sierra Bissonette on: 10/26/2023 04:47 PM     Modules accepted: Orders

## 2023-10-26 NOTE — Patient Instructions (Signed)
-  Rec Blood work  -Cont sinemet  25/100mg  1 tab 3x daily at 7am-11am-3pm; will consider switching back to crexont  or use vyalev after stabilize cognition  -Rec space sinemet  away from food that contains protein by 1 hr  -Rec switch donepezil  to Exelon patch 4.6 mg 1 patch daily for 1 month, rotating sites then increase to 9.5 mg 1 patch daily, rotating sites  -If develop rash from patch, get over the counter Flonase, spray on skin, let dry, then apply patch.  -Rec start memantine 10mg  1/2 tab 2x daily x2 weeks then increase to 1 tab 2x daily  -stagger start of 2 new meds by 2 weeks  -Rec start nuplazid 34mg  nightly at bedtime; will start approval process but don't start yet  -Cont trazodone  -Cont venlafaxine , bupropion , citalopram    -Cont ipratropium bromide spray  -Cont f/u [facility] to [facility] [place] [facility] shunt - Dr Noelle and PA Molly  -Cont aspirin   -Cont atorvastatin    -F/U in 3 months

## 2023-10-27 ENCOUNTER — Telehealth: Payer: Self-pay

## 2023-10-27 NOTE — Telephone Encounter (Addendum)
 Prior authorization initiated through cover my meds    Nuplazid 34 mg    Key- [id number]    Outcome- Approved    Coverage ends 01/02/2025    Nuplazid treatment and service request form with PA approval faxed to [facility] Oct 24    Dr. Pearly updated

## 2023-12-12 ENCOUNTER — Encounter: Payer: Self-pay | Admitting: Neurology

## 2024-01-31 ENCOUNTER — Encounter: Payer: Self-pay | Admitting: Neurology

## 2024-02-01 ENCOUNTER — Ambulatory Visit: Attending: Neurology | Admitting: Neurology

## 2024-02-01 ENCOUNTER — Encounter: Payer: Self-pay | Admitting: Neurology

## 2024-02-01 VITALS — BP 115/69 | HR 86 | Temp 98.0°F | Resp 18 | Ht 70.0 in | Wt 173.0 lb

## 2024-02-01 DIAGNOSIS — G912 (Idiopathic) normal pressure hydrocephalus: Secondary | ICD-10-CM | POA: Insufficient documentation

## 2024-02-01 DIAGNOSIS — E531 Pyridoxine deficiency: Secondary | ICD-10-CM | POA: Insufficient documentation

## 2024-02-01 DIAGNOSIS — F32A Depression, unspecified: Secondary | ICD-10-CM | POA: Insufficient documentation

## 2024-02-01 DIAGNOSIS — G20A1 Parkinson's disease without dyskinesia, without mention of fluctuations: Secondary | ICD-10-CM | POA: Insufficient documentation

## 2024-02-01 DIAGNOSIS — E559 Vitamin D deficiency, unspecified: Secondary | ICD-10-CM | POA: Insufficient documentation

## 2024-02-01 DIAGNOSIS — R413 Other amnesia: Secondary | ICD-10-CM | POA: Insufficient documentation

## 2024-02-01 DIAGNOSIS — Z8673 Personal history of transient ischemic attack (TIA), and cerebral infarction without residual deficits: Secondary | ICD-10-CM | POA: Insufficient documentation

## 2024-02-01 DIAGNOSIS — G20C Parkinsonism, unspecified: Secondary | ICD-10-CM | POA: Insufficient documentation

## 2024-02-01 DIAGNOSIS — F068 Other specified mental disorders due to known physiological condition: Secondary | ICD-10-CM | POA: Insufficient documentation

## 2024-02-01 DIAGNOSIS — G4700 Insomnia, unspecified: Secondary | ICD-10-CM | POA: Insufficient documentation

## 2024-02-01 MED ORDER — CARBIDOPA-LEVODOPA 25-100 MG PO TABS: 1.0000 | ORAL_TABLET | Freq: Four times a day (QID) | ORAL | Status: AC

## 2024-02-01 NOTE — Progress Notes (Signed)
 [email or web address]     [email or web address]      Re: [name]    DOB: 1945/01/08    MRN: [id number]    CC: PD    History of Present Illness:  [name] is a 79 y.o. 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 Pt is accompanied by family who reports that he made all the rec med changes and is tolerating them well with no side effects. In end of December he had urinary retention and so a foley cath was placed then in beginning of Jan he had UTI. He was on Abx x10 days and still has foley in. He was started on 2 new meds by urology yesterday but has not started them yet. Pt and family report he is much less confused after being treated for UTI. Patient denies falls, problems with sleep, mood problems, swallowing problems. Pt reports he sometimes talks to people that are not there but it is less than before.    PRIOR HPI:  Pt is currently on sinemet  25/100mg  1 tab 3x daily, venlafaxine , bupropion , donepezil , ipratropium bromide spray, trazodone, B12. He started shuffling in 2022 and began having dexterity issues and problems with ADLs. Then he had stroke in July 2022 while in assisted living. He moved her in 2022 and has been seeing Dr Alethea and about 2 yrs ago was diag with PD. He reacted positively to levadopa. Since Jan 2025 he has had a persistent decline with increased falls. He had spinal MRI to look for cause of falls. He started having more delusions after he was put on ER levadopa so he was switched back to sinemet  in June. He has significant depression. They are not sure what is from NPH vs PD vs CVA. He  has limited ability to use his hands so is no longer feeding himself d/t in part pain but also problems with dexterity. His family can no longer get him in and out of car as he is no longer able to help move. He is wheelchair bound now and it has been months since he was able to walk with walker.     Motor Symptoms: Freezing of gait (+), Wearing off (+), Dyskinesias (+), Falls (+), Festination (-), Propulsion (-), Tremor (+) left hand, shuffling gait (+), balance problems (+)    Non-Motor Symptoms: Visual hallucinations: (+) talking to people not there, Memory problems: (+), Depression: (+), Dysphagia: (+), Orthostasis: (-), Sialorrhea: (-), Constipation: (-), Nocturia: (-), Fragmented sleep: (+), Vivid Dreams: (-), RBD: (-), Snoring: (-), Anosmia: (-), Cramps: (-), Falls: (+), Compulsive Behaviors (-), Sleep attacks (-), Pedal edema (-), Micrographia (-), PBA (-)    Atypical Features: orthostasis (-), involuntary incontinence (+), incomplete bladder emptying (-), pseudobulbar affect (-), early onset of dementia or hallucinations (+), early onset of falls (+), alien limb syndrome (-)    Assistive Devices using: wheelchair    Meds tried/ failed: crexont , venlafaxine , bupropion , citalopram , donepezil , ipratropium bromide spray, quetiapine , B12, donepezil , memantine , rivastigmine  capsule, galantamine    Past Medical History:  Medical History[1]    Past Surgical History:  Past Surgical History[2]    Current Medications:  Current Medications[3]  Allergies:  Allergies[4]    Family History: denies PD, tremor, dementia  Father: throat cancer  Mother: died young of breast cancer  Siblings: 1 brother - heart problems  Other:    Social History:  Smoking: (-)  ETOH: (+) occasional wine  IVDA: (-)  Exposures: TBI (-), toxins/ Insecticides/ Pesticides (-), [place]  (-), Anti-emetics (-), Antipsychotics (-)  Marital status: widow  Living status: lives at [facility] in long term care  Children: 2 (one deceased age  32yo)  Employment: retired from job working in airline pilot in copywriter, advertising)  Education:    Driving status: no longer driving    Review of Systems:  As per patient questionnaire and see HPI above. All other systems were reviewed and are negative.    Vitals: BP 115/69 (BP Site: Left arm, Patient Position: Sitting, Cuff Size: Large)   Pulse 86   Temp 98 F (36.7 C) (Temporal)   Resp 18   Ht 1.778 m (5' 10)   Wt 78.5 kg (173 lb)   SpO2 96%   BMI 24.82 kg/m     Detailed Neurological Exam:  Speech: Speech is dysarthric and hypophonic  Cognition: The patient is alert and oriented to person only; MoCA 12/30 with global deficits   Cranial Nerves: Extraocular movements are intact. Trigeminal sensation is equal and intact to light touch in V1 through V3 distribution bilaterally and the muscles of mastication are normal. The face is symmetric. The palate elevates in the midline. Voice is normal. Hearing is intact bilaterally. Shoulder shrug is normal (Normal strength of the trapezius and sternocleidomastoid muscles bilaterally). The tongue has normal motion without fasciculations and protrudes midline.  Coordination: Normal finger to nose; No dysmetria or dysdiadochokinesia. slowed rapid alternating movements.  Gait: wheelchair bound  Observation: No asymmetry, no atrophy, and no involuntary movements noted.  Tone: increased muscle tone throughout.  Posture: unable to assess  Strength: Strength is V/V in the upper and lower limbs.  Vibratory Sensation: Not assessed  Light Touch: Normal light touch sensation in upper and lower extremities  Proprioception: Not assessed  Romberg: Not assessed  Pin Prick: Not assessed  Temperature: Not assessed  Cortical Sensory Modalities: Not assessed  Reflex Exam: DTR's: Deep tendon reflexes in the upper and lower extremities are normal bilaterally.    Studies:   MoCA 10/26/23: 12/30 with global deficits     Procedure: N/A    Assessment:  [name] is a 79 y.o.  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 MoCA 12/30 with global deficits     Differential Diagnosis: The symptomatology, as well as the relatively gradual progression in conjunction with as initial robust response to levodopa  is more supportive of a diagnosis of idiopathic Parkinson's disease with superimposed NPH. 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 Also, there is no evidence of excessive cerebellar signs or dysautonomia which may indicate MSA.      1. Parkinsonism, unspecified Parkinsonism type (CMS/HCC)        2. Memory loss  Vitamin B6    Whole Blood Vitamin B1 (Thiamine)    Syphilis Screen, IgG and IgM    Heavy Metals with Cadmium      3. NPH (  normal pressure hydrocephalus) (CMS/HCC)        4. Insomnia, unspecified type        5. 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 History of CVA (cerebrovascular accident)        8. Vitamin B6 deficiency  Vitamin B6      9. Vitamin D  deficiency          Plan:  -Reviewed Blood work which shoed low Vit D but some labs not done: B6, heavy metals, B1, RPR  -Rec blood work to check labs not completed  -Cont vitamin D3 2000 IU daily (Carlson brand gel caps found on [employer])  -Cont sinemet  25/100mg  1 tab 4x daily; will consider switching back to crexont  or use vyalev after stabilize cognition  -Rec space sinemet  away from food that contains protein by 1 hr  -Cont Exelon  patch 9.5 mg 1 patch daily, rotating sites  -If develop rash from patch, get over the counter Flonase , spray on skin, let dry, then apply patch.  -Cont memantine  10mg  1 tab 2x daily  -Cont nuplazid 34mg  nightly at bedtime  -Cont trazodone  -Cont  venlafaxine , bupropion   -Cont ipratropium bromide spray  -Cont f/u with [facility] to manage the VP shunt - Dr Noelle and PA Molly  -will monitor BP and consider stopping amlodipine  if needed  -Cont aspirin   -Cont atorvastatin    -discussed stroke risk factors HTN, HLD, DM, prior CVA  -Rec check stroke labs every 6 months  -LDL goal <70  -Fall precautions/ prevention stressed  -This is an ongoing continuous care relationship  -F/U in 3 months with NP Alexa and in 6 months with Dr Pearly      The total visit time was 40 min (minus time spent on procedure/ programming), examining and counseling the patient regarding current medication and treatment options available to the patient, reviewing records and/or consulting with other providers.           Director of Autonomic Disorders/ Dysautonomia Program    Electronically signed by: [email or web address] @    Signature Derived From [employer], February 01, 2024, 9:35 AM         [1]   Past Medical History:  Diagnosis Date    Abnormal cardiovascular stress test 09/19/2011    Echo/stress suggest RCA/left circumflex distribution ischemia; negative heart cath in 2001.  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)     History of DVT of lower extremity     Hyperlipemia     Hypertension     Major depression, recurrent     Mild cognitive impairment 10/16/2015    Normal pressure hydrocephalus (CMS/HCC) 06/21/2016    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    Parkinson's disease  (CMS/HCC)     Parkinsons (CMS/HCC)     Phlebitis and thrombophlebitis of superficial vessels of lower extremities,  bilateral 09/30/2011    Pulmonary fibrosis (CMS/HCC)     Shunt malfunction, subsequent encounter 09/04/2020    recent stroke and symptoms of shunt failure with shuntogram w/ evidence of distal obstruction, s/p elective: 09/04/2020: R frontal VP shunt revision (valve revision)    Situational mixed anxiety and depressive disorder     TIA (transient ischemic attack)     Tubular adenoma of rectum     Varicose veins of both legs with edema     Venous (peripheral) insufficiency    [2]   Past Surgical History:  Procedure Laterality Date    CARDIAC CATHETERIZATION  09/2011    Normal coronary arteries    CORONARY ANGIOPLASTY WITH STENT PLACEMENT  2017    NASAL POLYP EXCISION      PHACOEMULSIFICATION, IOL CATARACT Right 2013    Laser    VENTRICULOPERITONEAL SHUNT  09/09/2016    shunt revision 2022   [3]   Current Outpatient Medications:     acetaminophen  (TYLENOL ) 325 MG tablet, Take 2 tablets (650 mg) by mouth 2 (two) times daily, Disp: , Rfl:     amLODIPine  (NORVASC ) 10 MG tablet, Take 1 tablet (10 mg) by mouth daily, Disp: , Rfl:     aspirin  EC 81 MG EC tablet, Take 1 tablet (81 mg) by mouth daily, Disp: , Rfl:     atorvastatin  (LIPITOR) 40 MG tablet, Take 1 tablet (40 mg) by mouth nightly, Disp: , Rfl:     bisacodyl 5 MG EC tablet, Take 2 tablets (10 mg) by mouth once daily as needed for Constipation, Disp: , Rfl:     buPROPion  SR (WELLBUTRIN  SR) 150 MG 12 hr tablet, Take 1 tablet (150 mg) by mouth 2 (two) times daily, Disp: , Rfl:     carbamide peroxide (DEBROX) 6.5 % otic solution, 5 drops 2 (two) times daily, Disp: , Rfl:     carboxymethylcellulose (REFRESH TEARS) 0.5 % ophthalmic solution, Place 1 drop into both eyes 2 (two) times daily, Disp: , Rfl:     cetirizine  (ZyrTEC ) 10 MG chewable tablet, Chew 1 tablet (10 mg) by mouth daily, Disp: , Rfl:     finasteride (PROSCAR) 5 MG tablet, Take 1 tablet (5 mg)  by mouth once daily, Disp: , Rfl:     fluticasone  (FLONASE ) 50 MCG/ACT nasal spray, -If develop rash from rivastigmine  patch, spray flonase  on skin where plan on placing patch, let dry, then apply patch, Disp: 15.8 mL, Rfl: 6    gabapentin  (NEURONTIN ) 100 MG capsule, Take 1 capsule (100 mg) by mouth 3 (three) times daily, Disp: , Rfl:     ipratropium (ATROVENT) 0.06 % nasal spray, 2 sprays by Nasal route 4 (four) times daily, Disp: , Rfl:     loperamide (IMODIUM A-D) 2 MG tablet, Take 1 tablet (2 mg) by mouth 4 (four) times daily as needed for Diarrhea, Disp: , Rfl:     melatonin 3 mg tablet, Take 1 tablet (3 mg) by mouth, Disp: , Rfl:     memantine  (NAMENDA ) 10 MG tablet, 1/2 tab by mouth 2x daily x2 weeks then increase to 1 tab 2x daily, Disp: 180 tablet, Rfl: 3    omeprazole (PriLOSEC) 20 MG capsule, Take 1 capsule (20 mg) by mouth daily, Disp: , Rfl:     Pimavanserin Tartrate (Nuplazid) 34 MG Cap, Take 1 capsule (34 mg) by mouth once at bedtime, Disp: , Rfl:     polyethylene glycol (MIRALAX) 17 g packet, Take 17 g by mouth once daily,  Disp: , Rfl:     rivaroxaban  (XARELTO ) 10 MG Tab, Take 1 tablet (10 mg) by mouth daily, Disp: , Rfl:     rivastigmine  (EXELON ) 9.5 MG/24HR, 1 patch topical daily, rotating sites; start after completing 1 month of 4.6mg  patch, Disp: 90 patch, Rfl: 3    senna (SENOKOT) 8.6 MG tablet, Take 1 tablet (8.6 mg) by mouth once daily, Disp: , Rfl:     tamsulosin  (FLOMAX ) 0.4 MG Cap, Take 1 capsule (0.4 mg) by mouth once daily after dinner, Disp: , Rfl:     traZODone (DESYREL) 150 MG tablet, Take 1 tablet (150 mg) by mouth once daily, Disp: , Rfl:     venlafaxine  (EFFEXOR ) 75 MG tablet, Take 1 tablet (75 mg) by mouth 2 (two) times daily, Disp: , Rfl:     vitamin B-12 (CYANOCOBALAMIN ) 500 MCG tablet, Take 1 tablet (500 mcg) by mouth daily, Disp: , Rfl:     carbidopa -levodopa  (SINEMET ) 25-100 MG per tablet, Take 1 tablet by mouth 4 (four) times daily, Disp: , Rfl:   [4]   Allergies  Allergen  Reactions    Ace Inhibitors Cough

## 2024-02-01 NOTE — Patient Instructions (Signed)
-  Reviewed Blood work which shoed low Vit D but some labs not done: B6, heavy metals, B1, RPR  -Rec blood work to check labs not completed  -Cont vitamin D3 2000 IU daily (Carlson brand gel caps found on [employer])  -Cont sinemet  25/100mg  1 tab 4x daily  -Rec space sinemet  away from food that contains protein by 1 hr  -Cont Exelon  patch 9.5 mg 1 patch daily, rotating sites  -If develop rash from patch, get over the counter Flonase , spray on skin, let dry, then apply patch.  -Cont memantine  10mg  1 tab 2x daily  -Cont nuplazid 34mg  nightly at bedtime  -Cont trazodone  -Cont venlafaxine , bupropion   -Cont ipratropium bromide spray  -Cont f/u with [facility] to manage the VP shunt - Dr Noelle and PA Molly  -Cont aspirin   -Cont atorvastatin    -F/U in 3 months with NP Alexa and in 6 months with Dr Pearly

## 2024-08-09 ENCOUNTER — Ambulatory Visit: Admitting: Neurology
